# Patient Record
Sex: Female | Born: 1960 | Race: Black or African American | Hispanic: No | Marital: Married | Smoking: Never smoker
Health system: Southern US, Community
[De-identification: ages and names within clinical notes are randomized; demographics above are authoritative.]

## PROBLEM LIST (undated history)

## (undated) DIAGNOSIS — N760 Acute vaginitis: Secondary | ICD-10-CM

## (undated) DIAGNOSIS — I10 Essential (primary) hypertension: Secondary | ICD-10-CM

## (undated) DIAGNOSIS — E119 Type 2 diabetes mellitus without complications: Secondary | ICD-10-CM

## (undated) DIAGNOSIS — N946 Dysmenorrhea, unspecified: Secondary | ICD-10-CM

## (undated) DIAGNOSIS — D219 Benign neoplasm of connective and other soft tissue, unspecified: Secondary | ICD-10-CM

## (undated) DIAGNOSIS — N920 Excessive and frequent menstruation with regular cycle: Secondary | ICD-10-CM

## (undated) DIAGNOSIS — B9689 Other specified bacterial agents as the cause of diseases classified elsewhere: Secondary | ICD-10-CM

## (undated) HISTORY — DX: Excessive and frequent menstruation with regular cycle: N92.0

## (undated) HISTORY — DX: Other specified bacterial agents as the cause of diseases classified elsewhere: B96.89

## (undated) HISTORY — DX: Dysmenorrhea, unspecified: N94.6

## (undated) HISTORY — DX: Type 2 diabetes mellitus without complications: E11.9

## (undated) HISTORY — DX: Acute vaginitis: N76.0

## (undated) HISTORY — DX: Benign neoplasm of connective and other soft tissue, unspecified: D21.9

## (undated) HISTORY — DX: Essential (primary) hypertension: I10

---

## 1994-11-23 HISTORY — PX: TUBAL LIGATION: SHX77

## 1999-07-09 ENCOUNTER — Other Ambulatory Visit: Admission: RE | Admit: 1999-07-09 | Discharge: 1999-07-09 | Payer: Self-pay | Admitting: *Deleted

## 2002-07-31 ENCOUNTER — Encounter: Admission: RE | Admit: 2002-07-31 | Discharge: 2002-10-29 | Payer: Self-pay | Admitting: Family Medicine

## 2003-05-02 ENCOUNTER — Encounter: Payer: Self-pay | Admitting: Family Medicine

## 2003-05-02 ENCOUNTER — Ambulatory Visit (HOSPITAL_COMMUNITY): Admission: RE | Admit: 2003-05-02 | Discharge: 2003-05-02 | Payer: Self-pay | Admitting: Family Medicine

## 2003-08-07 ENCOUNTER — Encounter: Admission: RE | Admit: 2003-08-07 | Discharge: 2003-11-05 | Payer: Self-pay | Admitting: Family Medicine

## 2003-11-07 ENCOUNTER — Ambulatory Visit (HOSPITAL_COMMUNITY): Admission: RE | Admit: 2003-11-07 | Discharge: 2003-11-07 | Payer: Self-pay | Admitting: Family Medicine

## 2005-05-11 ENCOUNTER — Ambulatory Visit (HOSPITAL_COMMUNITY): Admission: RE | Admit: 2005-05-11 | Discharge: 2005-05-11 | Payer: Self-pay | Admitting: Family Medicine

## 2006-09-24 ENCOUNTER — Ambulatory Visit (HOSPITAL_COMMUNITY): Admission: RE | Admit: 2006-09-24 | Discharge: 2006-09-24 | Payer: Self-pay | Admitting: Family Medicine

## 2007-10-04 ENCOUNTER — Ambulatory Visit (HOSPITAL_COMMUNITY): Admission: RE | Admit: 2007-10-04 | Discharge: 2007-10-04 | Payer: Self-pay | Admitting: Family Medicine

## 2008-10-05 ENCOUNTER — Ambulatory Visit (HOSPITAL_COMMUNITY): Admission: RE | Admit: 2008-10-05 | Discharge: 2008-10-05 | Payer: Self-pay | Admitting: Family Medicine

## 2009-10-16 ENCOUNTER — Ambulatory Visit (HOSPITAL_COMMUNITY): Admission: RE | Admit: 2009-10-16 | Discharge: 2009-10-16 | Payer: Self-pay | Admitting: Family Medicine

## 2010-11-13 ENCOUNTER — Ambulatory Visit (HOSPITAL_COMMUNITY)
Admission: RE | Admit: 2010-11-13 | Discharge: 2010-11-13 | Payer: Self-pay | Source: Home / Self Care | Attending: Family Medicine | Admitting: Family Medicine

## 2011-02-10 ENCOUNTER — Encounter: Payer: Self-pay | Admitting: Interventional Cardiology

## 2011-10-20 ENCOUNTER — Other Ambulatory Visit (HOSPITAL_COMMUNITY): Payer: Self-pay | Admitting: Family Medicine

## 2011-10-20 DIAGNOSIS — Z1231 Encounter for screening mammogram for malignant neoplasm of breast: Secondary | ICD-10-CM

## 2011-11-20 ENCOUNTER — Ambulatory Visit (HOSPITAL_COMMUNITY)
Admission: RE | Admit: 2011-11-20 | Discharge: 2011-11-20 | Disposition: A | Discharge status: Home / Self Care | Payer: BC Managed Care – PPO | Source: Ambulatory Visit | Attending: Family Medicine | Admitting: Family Medicine

## 2011-11-20 DIAGNOSIS — Z1231 Encounter for screening mammogram for malignant neoplasm of breast: Secondary | ICD-10-CM

## 2012-02-16 ENCOUNTER — Encounter (INDEPENDENT_AMBULATORY_CARE_PROVIDER_SITE_OTHER): Payer: BC Managed Care – PPO | Admitting: Obstetrics and Gynecology

## 2012-02-16 DIAGNOSIS — N760 Acute vaginitis: Secondary | ICD-10-CM

## 2012-02-16 DIAGNOSIS — T192XXA Foreign body in vulva and vagina, initial encounter: Secondary | ICD-10-CM

## 2012-09-26 ENCOUNTER — Ambulatory Visit (INDEPENDENT_AMBULATORY_CARE_PROVIDER_SITE_OTHER): Payer: BC Managed Care – PPO | Admitting: Obstetrics and Gynecology

## 2012-09-26 ENCOUNTER — Encounter: Payer: Self-pay | Admitting: Obstetrics and Gynecology

## 2012-09-26 VITALS — BP 142/78 | Temp 98.8°F | Ht 62.25 in | Wt 186.0 lb

## 2012-09-26 DIAGNOSIS — N760 Acute vaginitis: Secondary | ICD-10-CM | POA: Insufficient documentation

## 2012-09-26 DIAGNOSIS — N946 Dysmenorrhea, unspecified: Secondary | ICD-10-CM | POA: Insufficient documentation

## 2012-09-26 DIAGNOSIS — N920 Excessive and frequent menstruation with regular cycle: Secondary | ICD-10-CM

## 2012-09-26 DIAGNOSIS — I10 Essential (primary) hypertension: Secondary | ICD-10-CM | POA: Insufficient documentation

## 2012-09-26 DIAGNOSIS — D219 Benign neoplasm of connective and other soft tissue, unspecified: Secondary | ICD-10-CM

## 2012-09-26 DIAGNOSIS — D649 Anemia, unspecified: Secondary | ICD-10-CM

## 2012-09-26 MED ORDER — MEDROXYPROGESTERONE ACETATE 10 MG PO TABS: ORAL_TABLET | ORAL | Status: DC

## 2012-09-26 MED ORDER — MISOPROSTOL 200 MCG PO TABS: ORAL_TABLET | ORAL | Status: DC

## 2012-09-26 NOTE — Progress Notes (Signed)
MENORRHAGIA:  When did bleeding start: 09/20/2012 How  Long: having 2 heavy cycles a month since 03/2012 How often changing pad/tampon: every 15 minutes Bleeding Disorders: no Cramping: yes Contraception: yes BTL Fibroids: yes questionable submucosal on last u/s Hormone Therapy: no New Medications: no Menopausal Symptoms: no Vag. Discharge: no Abdominal Pain: no Increased Stress: no  Orthostatic Pulses  Lying:       20  Sitting:     19  Standing: 22  Subjective:     Amber Kelly is a 51 y.o. woman,G3P2, who presents for irregular menses.  Bleeding pattern: Pt had cycle 08/31/2012-09/09/2012 which was extremely heavy with clots, Leaked through pants. Started again on 09/20/2012 and is still having today. States that cycle has lightened up a bit. States that her bleeding feels like "someone turned on a facet". States that she has been having 2 cycles per month since 03/2012.  Prior to that she had regular monthly cycles with no missed menses.  Denies any urinary tract symptoms, changes in bowel movements, nausea, vomiting or fever.   Current contraception: tubal ligation.  The following portions of the patient's history were reviewed and updated as appropriate: allergies, current medications, past family history, past medical history, past social history and past surgical history.  Review of Systems Pertinent items are noted in HPI.    Objective:    BP 142/78  Temp 98.8 F (37.1 C) (Oral)  Ht 5' 2.25" (1.581 m)  Wt 186 lb (84.369 kg)  BMI 33.75 kg/m2  LMP 09/20/2012  Weight:  Wt Readings from Last 1 Encounters:  09/26/12 186 lb (84.369 kg)    BMI: Body mass index is 33.75 kg/(m^2).  General Appearance: Alert, appropriate appearance for age. No acute distress HEENT: Grossly normal Neck / Thyroid: Supple, no masses, nodes or enlargement Lungs: clear to auscultation bilaterally Back: No CVA tenderness Cardiovascular: Regular rate and rhythm. S1, S2, no  murmur Gastrointestinal: Soft, non-tender, no masses or organomegaly Pelvic Exam: External genitalia: normal general appearance Vaginal: normal rugae and Blood in vault Cervix: presence of blood from cervical os and No contact bleeding Adnexa: non palpable Uterus: irregular enlargement and 10 wks size Exam limited by body habitus Rectovaginal: not indicated and deferred      Assessment:   Fibroids, probably submucosal Menorrhagia    Plan:   Provera 40 mg daily until bleeding stops SHG at that time with endo bx Therapeutic decision to be based on those results  Dierdre Forth MD

## 2012-09-27 NOTE — Addendum Note (Signed)
Addended by: Lerry Liner D on: 09/27/2012 08:47 AM   Modules accepted: Orders

## 2012-09-28 LAB — PAP IG W/ RFLX HPV ASCU

## 2012-10-04 ENCOUNTER — Telehealth: Payer: Self-pay | Admitting: Obstetrics and Gynecology

## 2012-10-10 ENCOUNTER — Other Ambulatory Visit: Payer: Self-pay

## 2012-10-10 DIAGNOSIS — N920 Excessive and frequent menstruation with regular cycle: Secondary | ICD-10-CM

## 2012-10-14 ENCOUNTER — Other Ambulatory Visit: Payer: Self-pay | Admitting: Obstetrics and Gynecology

## 2012-10-14 ENCOUNTER — Encounter: Payer: Self-pay | Admitting: Obstetrics and Gynecology

## 2012-10-14 ENCOUNTER — Ambulatory Visit (INDEPENDENT_AMBULATORY_CARE_PROVIDER_SITE_OTHER): Payer: BC Managed Care – PPO

## 2012-10-14 ENCOUNTER — Ambulatory Visit (INDEPENDENT_AMBULATORY_CARE_PROVIDER_SITE_OTHER): Payer: BC Managed Care – PPO | Admitting: Obstetrics and Gynecology

## 2012-10-14 VITALS — BP 130/76 | Temp 99.3°F

## 2012-10-14 DIAGNOSIS — N920 Excessive and frequent menstruation with regular cycle: Secondary | ICD-10-CM

## 2012-10-14 DIAGNOSIS — D649 Anemia, unspecified: Secondary | ICD-10-CM

## 2012-10-14 DIAGNOSIS — D259 Leiomyoma of uterus, unspecified: Secondary | ICD-10-CM

## 2012-10-14 DIAGNOSIS — D219 Benign neoplasm of connective and other soft tissue, unspecified: Secondary | ICD-10-CM

## 2012-10-14 NOTE — Progress Notes (Signed)
SONOHYSTEROGRAM  Indications for the procedure, risks and benefits have been reviewed.  Questions were answered.   A permit has been signed.  An ultrasound was performed.   PROCEDURE The vagina and cervix were prepped with Betadine.  The sonohysterogram catheter was placed inside the uterus.  30 cc of sterile saline were injected.  A 3-D ultrasound was performed.  An endometrial biopsy was performed per protocol   The patient tolerated her procedure well.  All instruments were removed.  The patient was returned to the supine position.   ULTRASOUND: Uterus: Length: 12.0 cm   Width:  8.12 cm   Height:  7.66 cm  Endo thickness:  0.777 cm   Left ovary:Normal Right ovary:Normal Fibroids:yes Myometrial fibroids    1) RT fundus:4.4cmx4.2cmx5.9cm                              2) RT LUS 3.2cmx3cmx3.7. Several 2cm fibroids are seen left of uterine midline  CDS fluid:no  Comment: Technically difficult due to fibroid involvement. There is a solid appearing focus in the LUS. This is suggestive of submucosal fibroid(s). Fundal cavity images were compromised due to posterior uterus angle and fibroid location. Adnexas-normal.  ASSESSMENT: Uterine fibroids with probable submucosal component Menorrhagia in perimenopausal age range Hx anemia  RECOMMENDATION: Options for management of fibroids reviewed: Myomectomy, may be amenable to hysteroscopic resection        Lupron Depot        Continuous hormonal suppression with Provera        Hysterectomy Pt wants hysterectomy when school is out. Last day of school is June 16. She will followup in 4 weeks to review bleeding pattern. She understands that inability to control menses may necessitate sooner surgery

## 2012-10-14 NOTE — Patient Instructions (Signed)
Hysterectomy Information  A hysterectomy is a procedure where your uterus is surgically removed. It will no longer be possible to have menstrual periods or to become pregnant. The tubes and ovaries can be removed (bilateral salpingo-oopherectomy) during this surgery as well.  REASONS FOR A HYSTERECTOMY  Persistent, abnormal bleeding.  Lasting (chronic) pelvic pain or infection.  The lining of the uterus (endometrium) starts growing outside the uterus (endometriosis).  The endometrium starts growing in the muscle of the uterus (adenomyosis).  The uterus falls down into the vagina (pelvic organ prolapse).  Symptomatic uterine fibroids.  Precancerous cells.  Cervical cancer or uterine cancer. TYPES OF HYSTERECTOMIES  Supracervical hysterectomy. This type removes the top part of the uterus, but not the cervix.  Total hysterectomy. This type removes the uterus and cervix.  Radical hysterectomy. This type removes the uterus, cervix, and the fibrous tissue that holds the uterus in place in the pelvis (parametrium). WAYS A HYSTERECTOMY CAN BE PERFORMED  Abdominal hysterectomy. A large surgical cut (incision) is made in the abdomen. The uterus is removed through this incision.  Vaginal hysterectomy. An incision is made in the vagina. The uterus is removed through this incision. There are no abdominal incisions.  Conventional laparoscopic hysterectomy. A thin, lighted tube with a camera (laparoscope) is inserted into 3 or 4 small incisions in the abdomen. The uterus is cut into small pieces. The small pieces are removed through the incisions, or they are removed through the vagina.  Laparoscopic assisted vaginal hysterectomy (LAVH). Three or four small incisions are made in the abdomen. Part of the surgery is performed laparoscopically and part vaginally. The uterus is removed through the vagina.  Robot-assisted laparoscopic hysterectomy. A laparoscope is inserted into 3 or 4 small  incisions in the abdomen. A computer-controlled device is used to give the surgeon a 3D image. This allows for more precise movements of surgical instruments. The uterus is cut into small pieces and removed through the incisions or removed through the vagina. RISKS OF HYSTERECTOMY   Bleeding and risk of blood transfusion. Tell your caregiver if you do not want to receive any blood products.  Blood clots in the legs or lung.  Infection.  Injury to surrounding organs.  Anesthesia problems or side effects.  Conversion to an abdominal hysterectomy. WHAT TO EXPECT AFTER A HYSTERECTOMY  You will be given pain medicine.  You will need to have someone with you for the first 3 to 5 days after you go home.  You will need to follow up with your surgeon in 2 to 4 weeks after surgery to evaluate your progress.  You may have early menopause symptoms like hot flashes, night sweats, and insomnia.  If you had a hysterectomy for a problem that was not a cancer or a condition that could lead to cancer, then you no longer need Pap tests. However, even if you no longer need a Pap test, a regular exam is a good idea to make sure no other problems are starting. Document Released: 05/05/2001 Document Revised: 02/01/2012 Document Reviewed: 06/20/2011 ExitCare Patient Information 2013 [employer].   Uterine Fibroid A uterine fibroid is a growth (tumor) that occurs in a woman's uterus. This type of tumor is not cancerous and does not spread out of the uterus. A woman can have one or many fibroids, and the fiboid(s) can become quite large. A fibroid can vary in size, weight, and where it grows in the uterus. Most fibroids do not require medical treatment, but some can   cause pain or heavy bleeding during and between periods. CAUSES  A fibroid is the result of a single uterine cell that keeps growing (unregulated), which is different than most cells in the human body. Most cells have a control mechanism that  keeps them from reproducing without control.  SYMPTOMS   Bleeding.  Pelvic pain and pressure.  Bladder problems due to the size of the fibroid.  Infertility and miscarriages depending on the size and location of the fibroid. DIAGNOSIS  A diagnosis is made by physical exam. Your caregiver may feel the lumpy tumors during a pelvic exam. Important information regarding size, location, and number of tumors can be gained by having an ultrasound. It is rare that other tests, such as a CT scan or MRI, are needed. TREATMENT   Your caregiver may recommend watchful waiting. This involves getting the fibroid checked by your caregiver to see if the fibroids grow or shrink.   Hormonal treatment or an intrauterine device (IUD) may be prescribed.   Surgery may be needed to remove the fibroids (myomectomy) or the uterus (hysterectomy). This depends on your situation. When fibroids interfere with fertility and a woman wants to become pregnant, a caregiver may recommend having the fibroids removed.  HOME CARE INSTRUCTIONS  Home care depends on how you were treated. In general:   Keep all follow-up appointments with your caregiver.   Only take medicine as told by your caregiver. Do not take aspirin. It can cause bleeding.   If you have excessive periods and soak tampons or pads in a half hour or less, contact your caregiver immediately. If your periods are troublesome but not so heavy, lie down with your feet raised slightly above your heart. Place cold packs on your lower abdomen.   If your periods are heavy, write down the number of pads or tampons you use per month. Bring this information to your caregiver.   Talk to your caregiver about taking iron pills.   Include green vegetables in your diet.   If you were prescribed a hormonal treatment, take the hormonal medicines as directed.   If you need surgery, ask your caregiver for information on your specific surgery.  SEEK IMMEDIATE  MEDICAL CARE IF:  You have pelvic pain or cramps not controlled with medicines.   You have a sudden increase in pelvic pain.   You have an increase of bleeding between and during periods.   You feel lightheaded or have fainting episodes.  MAKE SURE YOU:  Understand these instructions.  Will watch your condition.  Will get help right away if you are not doing well or get worse. Document Released: 11/06/2000 Document Revised: 02/01/2012 Document Reviewed: 11/30/2011 ExitCare Patient Information 2013 [employer].  

## 2012-10-18 LAB — PATHOLOGY

## 2012-10-24 ENCOUNTER — Other Ambulatory Visit (HOSPITAL_COMMUNITY): Payer: Self-pay | Admitting: Family Medicine

## 2012-10-24 DIAGNOSIS — Z1231 Encounter for screening mammogram for malignant neoplasm of breast: Secondary | ICD-10-CM

## 2012-10-25 ENCOUNTER — Encounter: Payer: BC Managed Care – PPO | Admitting: Obstetrics and Gynecology

## 2012-10-25 ENCOUNTER — Other Ambulatory Visit: Payer: Self-pay | Admitting: Obstetrics and Gynecology

## 2012-10-27 ENCOUNTER — Telehealth: Payer: Self-pay | Admitting: Obstetrics and Gynecology

## 2012-10-28 NOTE — Telephone Encounter (Signed)
Pt c/o still having light bleeding, only uses 1 pad per day and has bleeding when wiping after using the restroom. Pt states she was under the impression that the bleeding would stop completely. Pt is now out of medroxyprogesterone pills and states that she thought another rx was supposed to be sent in to her pharmacy. What would you like for pt plan of care?

## 2012-10-31 ENCOUNTER — Telehealth: Payer: Self-pay | Admitting: Obstetrics and Gynecology

## 2012-10-31 NOTE — Telephone Encounter (Signed)
TC to pt. But unable to hear pt. Called again and LM to return call.  No answer at home #.

## 2012-10-31 NOTE — Telephone Encounter (Signed)
TC to pt.  States complete Provera 10/28/12.  Began with increased bleeding 10/29/12, using 3 full pads/day. Questioning if needs Rf to be on Provera continuously.  Will consult with Dr Maryellen Pile.

## 2012-11-02 NOTE — Telephone Encounter (Signed)
Restart Provera 40 mg daily

## 2012-11-02 NOTE — Telephone Encounter (Signed)
Lm on vm to cb per [facility] recs.  

## 2012-11-03 MED ORDER — MEDROXYPROGESTERONE ACETATE 10 MG PO TABS: ORAL_TABLET | ORAL | Status: DC

## 2012-11-03 NOTE — Telephone Encounter (Signed)
Tc from pt. Informed pt to restart Provera 40mg  daily. Pt has a fu appt on 11/21/12 with [facility]. Rx e-pres to pharm on file.  Pt agrees.

## 2012-11-09 NOTE — Telephone Encounter (Signed)
Completed by cw

## 2012-11-21 ENCOUNTER — Ambulatory Visit (HOSPITAL_COMMUNITY)
Admission: RE | Admit: 2012-11-21 | Discharge: 2012-11-21 | Disposition: A | Discharge status: Home / Self Care | Payer: BC Managed Care – PPO | Source: Ambulatory Visit | Attending: Family Medicine | Admitting: Family Medicine

## 2012-11-21 DIAGNOSIS — Z1231 Encounter for screening mammogram for malignant neoplasm of breast: Secondary | ICD-10-CM | POA: Insufficient documentation

## 2012-11-22 ENCOUNTER — Encounter: Payer: Self-pay | Admitting: Obstetrics and Gynecology

## 2012-11-22 ENCOUNTER — Ambulatory Visit (INDEPENDENT_AMBULATORY_CARE_PROVIDER_SITE_OTHER): Payer: BC Managed Care – PPO | Admitting: Obstetrics and Gynecology

## 2012-11-22 VITALS — BP 112/70 | Ht 62.0 in | Wt 185.0 lb

## 2012-11-22 DIAGNOSIS — D219 Benign neoplasm of connective and other soft tissue, unspecified: Secondary | ICD-10-CM

## 2012-11-22 DIAGNOSIS — D259 Leiomyoma of uterus, unspecified: Secondary | ICD-10-CM

## 2012-11-22 DIAGNOSIS — D649 Anemia, unspecified: Secondary | ICD-10-CM

## 2012-11-22 DIAGNOSIS — N920 Excessive and frequent menstruation with regular cycle: Secondary | ICD-10-CM

## 2012-11-22 LAB — HEMOGLOBIN: Hemoglobin: 10.9 g/dL — ABNORMAL LOW (ref 12.0–15.0)

## 2012-11-22 MED ORDER — MEDROXYPROGESTERONE ACETATE 10 MG PO TABS: ORAL_TABLET | ORAL | Status: DC

## 2012-11-22 NOTE — Progress Notes (Signed)
GYN PROBLEM VISIT  Subjective:Ms. Amber Kelly is a 51 y.o. year old female,G3P2, who presents for a problem visit. Bled from [facility] for about a week. Took last Provera 10/28/12 and  restarted bleeding.  Started Provera again 11/03/12 and bleeding stopped until 11/17/12, and continues with that light bleeding through today.  Has continued taking iron.  Denies light headedness, SOB or chest pain.  Objective: BP 112/70  Ht 5\' 2"  (1.575 m)  Wt 185 lb (83.915 kg)  BMI 33.84 kg/m2  LMP 11/17/2012  LMP 11/17/2012   GI: suprapubic mass which is nontender  External genitalia: blood covered Vaginal: Blood in vault Cervix: normal appearance Adnexa: No masses felt Uterus: irregular enlargement 12 wks size Rectal: deferred Exam limited by body habitus  Assessment: Menorrhagia Fibroids, probable submucosal component Some improvement with Provera Anemia, no hemodynamic sx at this time. Chronic hypertension  Plan: Pt wants hysterectomy as definitive therapy for menorrhagia, but wants to try to delay until summer Continue Provera 40 mg daily Continue Fe hgb today Discussed with pt that if bleeding is not controlled by Provera or if Hgb is not stable or increasing on iron, that I would advise hysterectomy sooner than summer. F/U 6 wks for aex and review of bleeding    Dierdre Forth, MD  11/22/2012 8:55 AM

## 2012-12-07 ENCOUNTER — Ambulatory Visit: Payer: BC Managed Care – PPO | Admitting: Obstetrics and Gynecology

## 2013-01-03 ENCOUNTER — Encounter: Payer: Self-pay | Admitting: Obstetrics and Gynecology

## 2013-01-03 ENCOUNTER — Ambulatory Visit: Payer: BC Managed Care – PPO | Admitting: Obstetrics and Gynecology

## 2013-01-03 VITALS — BP 140/64 | HR 70 | Resp 16 | Ht 62.0 in | Wt 184.0 lb

## 2013-01-03 DIAGNOSIS — D649 Anemia, unspecified: Secondary | ICD-10-CM | POA: Insufficient documentation

## 2013-01-03 DIAGNOSIS — E669 Obesity, unspecified: Secondary | ICD-10-CM

## 2013-01-03 DIAGNOSIS — N920 Excessive and frequent menstruation with regular cycle: Secondary | ICD-10-CM

## 2013-01-03 DIAGNOSIS — D219 Benign neoplasm of connective and other soft tissue, unspecified: Secondary | ICD-10-CM

## 2013-01-03 DIAGNOSIS — N946 Dysmenorrhea, unspecified: Secondary | ICD-10-CM

## 2013-01-03 LAB — TSH: TSH: 0.436 u[IU]/mL (ref 0.350–4.500)

## 2013-01-03 LAB — HEMOGLOBIN AND HEMATOCRIT, BLOOD
HCT: 37 % (ref 36.0–46.0)
Hemoglobin: 12.5 g/dL (ref 12.0–15.0)

## 2013-01-03 NOTE — Progress Notes (Signed)
AEX  Last Pap: 08/2012 WNL: Yes Regular Periods:yes Contraception: BTL  Monthly Breast exam:yes Tetanus<40yrs:yes Nl.Bladder Function:yes Daily BMs:yes Healthy Diet:no Calcium:yes Mammogram:yes Date of Mammogram: 10/2012 WNL Exercise:yes Have often Exercise: 2 x wk Seatbelt: yes Abuse at home: no Stressful work:no Sigmoid-colonoscopy: None Bone Density: No PCP: Noelle Redmon Change in PMH: None Change in RUE:AVWU  Subjective:    Amber Kelly is a 52 y.o. female, G3P2, who presents for an annual exam. Bleeding is much lighter, but still having almost daily bleeding since 11/22/12 visit.  Cramping occurred only 12/26/12 as if menses were beginnng and bleeding was slightly heavier, but not as heavy as prior to Provera. No other abdominal pain.  No nausea or vomiting.  No dyspareunia, no increased bleeding with intercourse.  Taking iron supplement daily    History   Social History  . Marital Status: Married    Spouse Name: N/A    Number of Children: N/A  . Years of Education: N/A   Social History Main Topics  . Smoking status: Never Smoker   . Smokeless tobacco: Never Used  . Alcohol Use: No  . Drug Use: No  . Sexually Active: Yes    Birth Control/ Protection: Surgical     Comment: btl   Other Topics Concern  . None   Social History Narrative  . None    Menstrual cycle:   LMP: Patient's last menstrual period was 12/26/2012.           Cycle: see above  The following portions of the patient's history were reviewed and updated as appropriate: allergies, current medications, past family history, past medical history, past social history, past surgical history and problem list.  Review of Systems Pertinent items are noted in HPI. Breast:Negative for breast lump,nipple discharge or nipple retraction Gastrointestinal: Negative for abdominal pain, change in bowel habits or rectal bleeding Urinary:negative   Objective:    BP 140/64  Pulse 70  Resp 16  Ht 5'  2" (1.575 m)  Wt 184 lb (83.462 kg)  BMI 33.65 kg/m2  LMP 12/26/2012    Weight:  Wt Readings from Last 1 Encounters:  01/03/13 184 lb (83.462 kg)          BMI: Body mass index is 33.65 kg/(m^2).  General Appearance: Alert, appropriate appearance for age. No acute distress HEENT: Grossly normal Neck / Thyroid: Supple, no masses, nodes or enlargement Lungs: clear to auscultation bilaterally Back: No CVA tenderness Breast Exam: No masses or nodes.No dimpling, nipple retraction or discharge. Cardiovascular: Regular rate and rhythm. S1, S2, no murmur Gastrointestinal: Soft, non-tender, no masses or organomegaly Pelvic Exam: External genitalia: normal general appearance Vaginal: normal rugae and blood in vault Cervix: normal appearance and presence of blood from cervical os Adnexa: cannot separate from enlarged uterus Uterus: irregular enlargement about 16 weeks size with some mobility. Exam limited by body habitus Rectovaginal: normal rectal, no masses Lymphatic Exam: Non-palpable nodes in neck, clavicular, axillary, or inguinal regions Skin: no rash or abnormalities Neurologic: Normal gait and speech, no tremor  Psychiatric: Alert and oriented, appropriate affect.   Wet Prep:not applicable Urinalysis:not applicable UPT: Not done   Assessment:    Fibroids  Abnormal uterine bleeding not responsive to Provera Anemia Hx c/s x 2    Plan:    mammogram pap smear additional lab tests per orders STD screening: declined Contraception:bilateral tubal ligation Continue iron Followup based on labs    Dierdre Forth MD

## 2013-04-26 ENCOUNTER — Other Ambulatory Visit: Payer: Self-pay | Admitting: Obstetrics and Gynecology

## 2013-05-08 ENCOUNTER — Encounter (HOSPITAL_COMMUNITY): Payer: Self-pay | Admitting: Pharmacist

## 2013-05-09 ENCOUNTER — Encounter: Payer: Self-pay | Admitting: Obstetrics and Gynecology

## 2013-05-10 ENCOUNTER — Other Ambulatory Visit (HOSPITAL_COMMUNITY): Payer: Self-pay | Admitting: Obstetrics and Gynecology

## 2013-05-10 NOTE — H&P (Signed)
Amber Kelly is a 52 y.o. female,  P 2-0-1-2 who presents for hysterectomy because of symptomatic uterine fibroids and menorrhagia.  Patient had normal monthly periods until October 2013 when she began to "flow like a faucet"  requriing overnight pad change every 30 minutes then it gradually tapered to more spotting.  She didn't notice any bleeding for about 3 weeks then resumed in November with the need to change a tampon with panty liner 3 times a day.  Cramping with this bleeding was minimal compared to her usual cramps  rated as 7/10 on a 10 point pain scale.  Patient's TSH was normal hemoglobin was 10.9 so she began iron supplementation. A pelvic ultrasound in November  2013 showed: uterus-12.0 x 8.12 x 7.66 cm with an endometrium-0.77 cm.  There were #2 measurable fibroids: right fundal-4.4 x 4.2 x 5.9 cm,  right lower uterine segment-3.2 x 3.0 x 3.7 cm and several  2 cm myometrial fibroids seen left of the uterus midline. A sono-hysterogram at that time  revealed a possible  solid focus in the lower uterine segment with question of a submucosal fibroid however the procedure was technically difficult due to fundal cavity changes caused by fibroids and the  posterior uterine angle.  Both ovaries appeared normal on this study.  An endometrial biopsy during this evaluation was benign.  The patient admits to urinary frequency but denies urinary urgency, incontinence changes in bowel function, vaginitis symptoms or dyspareunia.  The patient was placed on Provera 40 mg daily to manage her persistent bleeding.  As long as she took the medication, her bleeding only required the use of a tampon with panty liner daily but if she did not take her medication as directed, the flow with increase.  A review of both medical and surgical management options were given to the patient and after careful consideration she decided to proceed with definitive therapy in the form of hysterectomy.   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 amLODipine-benazepril (LOTREL) 10-40 MG per capsule Take 1 capsule by mouth daily.      Marland Kitchen CALCIUM PO Take 1 tablet by mouth daily.      . cloNIDine (CATAPRES) 0.1 MG tablet Take 0.1 mg by mouth 2 (two) times daily.      . cycloSPORINE (RESTASIS) 0.05 % ophthalmic emulsion Place 1 drop into both eyes 2 (two) times daily.      Marland Kitchen glipiZIDE (GLUCOTROL) 5 MG tablet Take 5 mg by mouth daily before breakfast.       . iron polysaccharides (NIFEREX) 150 MG capsule Take 150 mg by mouth daily.       Marland Kitchen KRILL OIL OMEGA-3 PO Take 1 capsule by mouth daily. Mega Red      .  medroxyPROGESTERone (PROVERA) 10 MG tablet Pt to take 4 tablets by mouth daily(40mg  total)  120 tablet  12  . Multiple Vitamin (MULTIVITAMIN WITH MINERALS) TABS Take 1 tablet by mouth daily.      . valsartan-hydrochlorothiazide (DIOVAN-HCT) 160-25 MG per tablet Take 1 tablet by mouth daily.       No facility-administered encounter medications on file as of 05/10/2013.  Diclofenac 75 mg prn Ferex 150 mg daily Ketoconazole 2% Shampoo prn Lotemax 0.5% Eye Gel Drops daily  Denies sensitivity to peanuts, shellfish, soy, latex or  adhesives. (Pistachios will increase her blood pressure)  No Known Allergies  ROS: Admits to glasses but  denies headache, vision changes, nasal congestion, dysphagia, tinnitus, dizziness, hoarseness, cough,  chest pain, shortness of breath, nausea, vomiting, diarrhea,constipation,  urgency  dysuria, hematuria, vaginitis symptoms, pelvic pain, swelling of joints,easy bruising,  myalgias, arthralgias, skin rashes, unexplained weight loss and except as is mentioned in the history of present illness, patient's review of systems is otherwise negative.   Physical Exam  Bp  110/78  P 78  R 12  T 99.4 degrees F orally  Weight  183  Height  5'4"    BMI  31.4  Neck: supple without masses or thyromegaly Lungs: clear to auscultation Heart: regular rate and rhythm Abdomen: soft, non-tender, with a firm mass from pelvis to mid-position between symphysis pubis and umbilicus;  no organomegaly Pelvic:EGBUS- wnl; vagina-normal rugae with scant blood; uterus-irregular, 14-16 week size, cervix without lesions or motion tenderness; adnexae-no tenderness or masses Extremities:  no clubbing, cyanosis or edema   Assesment:  Symptomatic Uterine Fibroids                        Menorrhagia                        H/O Anemia                           Disposition:  A discussion was held with patient regarding the indication for her procedure(s) along with the risks, which include but are not limited to: reaction to anesthesia, damage to adjacent organs, infection and excessive bleeding. The patient verbalized understanding of these risks and has consented to proceed with a Total Abdominal Hysterectomy with Bilateral Salpingectomy at [facility] on May 16, 2013 at 9:45 a.m.   CSN# [id number]   Phil Michels J. Lowell Guitar, PA-C  for Dr. Maris Berger. Haygood

## 2013-05-11 ENCOUNTER — Encounter (HOSPITAL_COMMUNITY): Payer: Self-pay

## 2013-05-11 ENCOUNTER — Encounter (HOSPITAL_COMMUNITY)
Admission: RE | Admit: 2013-05-11 | Discharge: 2013-05-11 | Disposition: A | Discharge status: Home / Self Care | Payer: BC Managed Care – PPO | Source: Ambulatory Visit | Attending: Obstetrics and Gynecology | Admitting: Obstetrics and Gynecology

## 2013-05-11 DIAGNOSIS — Z01812 Encounter for preprocedural laboratory examination: Secondary | ICD-10-CM | POA: Insufficient documentation

## 2013-05-11 DIAGNOSIS — Z01818 Encounter for other preprocedural examination: Secondary | ICD-10-CM | POA: Insufficient documentation

## 2013-05-11 LAB — BASIC METABOLIC PANEL
BUN: 8 mg/dL (ref 6–23)
CO2: 28 mEq/L (ref 19–32)
Calcium: 9.7 mg/dL (ref 8.4–10.5)
Chloride: 101 mEq/L (ref 96–112)
Creatinine, Ser: 0.63 mg/dL (ref 0.50–1.10)
Glucose, Bld: 229 mg/dL — ABNORMAL HIGH (ref 70–99)

## 2013-05-11 LAB — CBC
HCT: 36.4 % (ref 36.0–46.0)
Hemoglobin: 12.2 g/dL (ref 12.0–15.0)
MCH: 27.4 pg (ref 26.0–34.0)
MCHC: 33.5 g/dL (ref 30.0–36.0)
MCV: 81.6 fL (ref 78.0–100.0)
RDW: 13.2 % (ref 11.5–15.5)

## 2013-05-11 NOTE — Pre-Procedure Instructions (Signed)
Chandra/Dr Haygood's office called to confirm labs for glucose and K+, she states MD made aware.

## 2013-05-11 NOTE — Pre-Procedure Instructions (Signed)
Glucose of 229 and K+ of 3.2 reported to Dr Malen Gauze, no orders given. Left message for MD nurse.

## 2013-05-11 NOTE — Patient Instructions (Addendum)
[facility]  05/11/2013   Your procedure is scheduled on:  05/16/13  Enter through the Main Entrance of [facility] at 8 AM.  Pick up the phone at the desk and dial [phone].   Call this number if you have problems the morning of surgery: [phone]   Remember:   Do not eat food:After Midnight.  Do not drink clear liquids: After Midnight.  Take these medicines the morning of surgery with A SIP OF WATER: all blood pressure medication, hold diabetes medication, iron and vitamins.   Do not wear jewelry, make-up or nail polish.  Do not wear lotions, powders, or perfumes. You may wear deodorant.  Do not shave 48 hours prior to surgery.  Do not bring valuables to the hospital.  [facility] is not responsible                  for any belongings or valuables brought to the hospital.  Contacts, dentures or bridgework may not be worn into surgery.  Leave suitcase in the car. After surgery it may be brought to your room.  For patients admitted to the hospital, checkout time is 11:00 AM the day of                discharge.   Patients discharged the day of surgery will not be allowed to drive                   home.  Name and phone number of your driver: NA  Special Instructions: Shower using CHG 2 nights before surgery and the night before surgery.  If you shower the day of surgery use CHG.  Use special wash - you have one bottle of CHG for all showers.  You should use approximately 1/3 of the bottle for each shower.   Please read over the following fact sheets that you were given: Surgical Site Infection Prevention

## 2013-05-15 ENCOUNTER — Encounter (HOSPITAL_COMMUNITY): Payer: Self-pay | Admitting: Obstetrics and Gynecology

## 2013-05-15 MED ORDER — DEXTROSE 5 % IV SOLN
2.0000 g | INTRAVENOUS | Status: AC
  Administered 2013-05-16: 2 g via INTRAVENOUS
  Filled 2013-05-15: qty 2

## 2013-05-16 ENCOUNTER — Encounter (HOSPITAL_COMMUNITY)
Admission: RE | Disposition: A | Discharge status: Home / Self Care | Payer: Self-pay | Source: Ambulatory Visit | Attending: Obstetrics and Gynecology

## 2013-05-16 ENCOUNTER — Inpatient Hospital Stay (HOSPITAL_COMMUNITY)
Admission: RE | Admit: 2013-05-16 | Discharge: 2013-05-18 | DRG: 358 | Disposition: A | Discharge status: Home / Self Care | Payer: BC Managed Care – PPO | Source: Ambulatory Visit | Attending: Obstetrics and Gynecology | Admitting: Obstetrics and Gynecology

## 2013-05-16 ENCOUNTER — Inpatient Hospital Stay (HOSPITAL_COMMUNITY): Payer: BC Managed Care – PPO | Admitting: Anesthesiology

## 2013-05-16 ENCOUNTER — Encounter (HOSPITAL_COMMUNITY): Payer: Self-pay | Admitting: Anesthesiology

## 2013-05-16 DIAGNOSIS — D219 Benign neoplasm of connective and other soft tissue, unspecified: Secondary | ICD-10-CM | POA: Diagnosis present

## 2013-05-16 DIAGNOSIS — E119 Type 2 diabetes mellitus without complications: Secondary | ICD-10-CM | POA: Diagnosis present

## 2013-05-16 DIAGNOSIS — D250 Submucous leiomyoma of uterus: Secondary | ICD-10-CM | POA: Diagnosis present

## 2013-05-16 DIAGNOSIS — E876 Hypokalemia: Secondary | ICD-10-CM | POA: Diagnosis present

## 2013-05-16 DIAGNOSIS — N920 Excessive and frequent menstruation with regular cycle: Principal | ICD-10-CM | POA: Diagnosis present

## 2013-05-16 DIAGNOSIS — D252 Subserosal leiomyoma of uterus: Secondary | ICD-10-CM | POA: Diagnosis present

## 2013-05-16 DIAGNOSIS — E669 Obesity, unspecified: Secondary | ICD-10-CM | POA: Diagnosis present

## 2013-05-16 DIAGNOSIS — N946 Dysmenorrhea, unspecified: Secondary | ICD-10-CM | POA: Diagnosis present

## 2013-05-16 DIAGNOSIS — D251 Intramural leiomyoma of uterus: Secondary | ICD-10-CM | POA: Diagnosis present

## 2013-05-16 DIAGNOSIS — D649 Anemia, unspecified: Secondary | ICD-10-CM | POA: Diagnosis present

## 2013-05-16 DIAGNOSIS — I10 Essential (primary) hypertension: Secondary | ICD-10-CM | POA: Diagnosis present

## 2013-05-16 HISTORY — PX: ABDOMINAL HYSTERECTOMY: SHX81

## 2013-05-16 LAB — GLUCOSE, CAPILLARY
Glucose-Capillary: 157 mg/dL — ABNORMAL HIGH (ref 70–99)
Glucose-Capillary: 131 mg/dL — ABNORMAL HIGH (ref 70–99)
Glucose-Capillary: 139 mg/dL — ABNORMAL HIGH (ref 70–99)
Glucose-Capillary: 132 mg/dL — ABNORMAL HIGH (ref 70–99)

## 2013-05-16 SURGERY — HYSTERECTOMY, ABDOMINAL
Anesthesia: General | Site: Abdomen | Laterality: Bilateral | Wound class: Clean Contaminated

## 2013-05-16 MED ORDER — MENTHOL 3 MG MT LOZG: 1.0000 | LOZENGE | OROMUCOSAL | Status: DC | PRN

## 2013-05-16 MED ORDER — LIDOCAINE HCL (CARDIAC) 20 MG/ML IV SOLN
INTRAVENOUS | Status: DC | PRN
  Administered 2013-05-16: 80 mg via INTRAVENOUS

## 2013-05-16 MED ORDER — FENTANYL CITRATE 0.05 MG/ML IJ SOLN
INTRAMUSCULAR | Status: DC | PRN
  Administered 2013-05-16: 100 ug via INTRAVENOUS
  Administered 2013-05-16: 50 ug via INTRAVENOUS
  Administered 2013-05-16: 100 ug via INTRAVENOUS

## 2013-05-16 MED ORDER — LACTATED RINGERS IV SOLN
INTRAVENOUS | Status: DC
  Administered 2013-05-16 – 2013-05-17 (×3): via INTRAVENOUS

## 2013-05-16 MED ORDER — ONDANSETRON HCL 4 MG/2ML IJ SOLN: 4.0000 mg | Freq: Four times a day (QID) | INTRAMUSCULAR | Status: DC | PRN

## 2013-05-16 MED ORDER — LACTATED RINGERS IV SOLN
INTRAVENOUS | Status: DC
  Administered 2013-05-16 (×3): via INTRAVENOUS

## 2013-05-16 MED ORDER — DIPHENHYDRAMINE HCL 50 MG/ML IJ SOLN: 12.5000 mg | Freq: Four times a day (QID) | INTRAMUSCULAR | Status: DC | PRN

## 2013-05-16 MED ORDER — HYDROMORPHONE HCL PF 1 MG/ML IJ SOLN
INTRAMUSCULAR | Status: DC | PRN
  Administered 2013-05-16 (×2): .5 mg via INTRAVENOUS

## 2013-05-16 MED ORDER — SODIUM CHLORIDE 0.9 % IJ SOLN: 9.0000 mL | INTRAMUSCULAR | Status: DC | PRN

## 2013-05-16 MED ORDER — METOCLOPRAMIDE HCL 5 MG/ML IJ SOLN: 10.0000 mg | Freq: Once | INTRAMUSCULAR | Status: DC | PRN

## 2013-05-16 MED ORDER — AMLODIPINE BESY-BENAZEPRIL HCL 10-40 MG PO CAPS: 1.0000 | ORAL_CAPSULE | Freq: Every day | ORAL | Status: DC

## 2013-05-16 MED ORDER — KETOROLAC TROMETHAMINE 30 MG/ML IJ SOLN
30.0000 mg | Freq: Four times a day (QID) | INTRAMUSCULAR | Status: AC
  Administered 2013-05-16 – 2013-05-17 (×3): 30 mg via INTRAVENOUS
  Filled 2013-05-16 (×3): qty 1

## 2013-05-16 MED ORDER — NEOSTIGMINE METHYLSULFATE 1 MG/ML IJ SOLN
INTRAMUSCULAR | Status: DC | PRN
  Administered 2013-05-16: 3 mg via INTRAVENOUS

## 2013-05-16 MED ORDER — ONDANSETRON HCL 4 MG/2ML IJ SOLN
INTRAMUSCULAR | Status: DC | PRN
  Administered 2013-05-16: 4 mg via INTRAVENOUS

## 2013-05-16 MED ORDER — NALOXONE HCL 0.4 MG/ML IJ SOLN: 0.4000 mg | INTRAMUSCULAR | Status: DC | PRN

## 2013-05-16 MED ORDER — BENAZEPRIL HCL 40 MG PO TABS
40.0000 mg | ORAL_TABLET | Freq: Every day | ORAL | Status: DC
  Administered 2013-05-16 – 2013-05-17 (×2): 40 mg via ORAL
  Filled 2013-05-16 (×3): qty 1

## 2013-05-16 MED ORDER — CYCLOSPORINE 0.05 % OP EMUL
1.0000 [drp] | Freq: Two times a day (BID) | OPHTHALMIC | Status: DC
  Administered 2013-05-16: 1 [drp] via OPHTHALMIC
  Administered 2013-05-17: 11:00:00 via OPHTHALMIC
  Administered 2013-05-17: 1 [drp] via OPHTHALMIC
  Filled 2013-05-16 (×4): qty 1

## 2013-05-16 MED ORDER — DIPHENHYDRAMINE HCL 12.5 MG/5ML PO ELIX: 12.5000 mg | ORAL_SOLUTION | Freq: Four times a day (QID) | ORAL | Status: DC | PRN

## 2013-05-16 MED ORDER — SCOPOLAMINE 1 MG/3DAYS TD PT72
MEDICATED_PATCH | TRANSDERMAL | Status: AC
  Administered 2013-05-16: 1.5 mg via TRANSDERMAL
  Filled 2013-05-16: qty 1

## 2013-05-16 MED ORDER — FENTANYL CITRATE 0.05 MG/ML IJ SOLN
INTRAMUSCULAR | Status: AC
  Filled 2013-05-16: qty 5

## 2013-05-16 MED ORDER — KETOROLAC TROMETHAMINE 30 MG/ML IJ SOLN
INTRAMUSCULAR | Status: DC | PRN
  Administered 2013-05-16: 30 mg via INTRAVENOUS

## 2013-05-16 MED ORDER — KETOROLAC TROMETHAMINE 30 MG/ML IJ SOLN
INTRAMUSCULAR | Status: AC
  Filled 2013-05-16: qty 1

## 2013-05-16 MED ORDER — LIDOCAINE HCL (CARDIAC) 20 MG/ML IV SOLN
INTRAVENOUS | Status: AC
  Filled 2013-05-16: qty 5

## 2013-05-16 MED ORDER — GLYCOPYRROLATE 0.2 MG/ML IJ SOLN
INTRAMUSCULAR | Status: DC | PRN
  Administered 2013-05-16: 0.4 mg via INTRAVENOUS

## 2013-05-16 MED ORDER — ONDANSETRON HCL 4 MG PO TABS: 4.0000 mg | ORAL_TABLET | Freq: Three times a day (TID) | ORAL | Status: DC | PRN

## 2013-05-16 MED ORDER — MIDAZOLAM HCL 2 MG/2ML IJ SOLN
INTRAMUSCULAR | Status: AC
  Filled 2013-05-16: qty 2

## 2013-05-16 MED ORDER — HYDROMORPHONE 0.3 MG/ML IV SOLN: INTRAVENOUS | Status: DC

## 2013-05-16 MED ORDER — CLONIDINE HCL 0.1 MG PO TABS
0.1000 mg | ORAL_TABLET | Freq: Two times a day (BID) | ORAL | Status: DC
  Administered 2013-05-16 – 2013-05-18 (×4): 0.1 mg via ORAL
  Filled 2013-05-16 (×5): qty 1

## 2013-05-16 MED ORDER — HYDROMORPHONE 0.3 MG/ML IV SOLN
INTRAVENOUS | Status: DC
  Administered 2013-05-16: 0.4 mg via INTRAVENOUS
  Administered 2013-05-16: 0.399 mg via INTRAVENOUS
  Administered 2013-05-17: 0.999 mg via INTRAVENOUS
  Administered 2013-05-17: 0.199 mg via INTRAVENOUS
  Administered 2013-05-17: 0.6 mg via INTRAVENOUS

## 2013-05-16 MED ORDER — ONDANSETRON HCL 4 MG/2ML IJ SOLN
INTRAMUSCULAR | Status: AC
  Filled 2013-05-16: qty 2

## 2013-05-16 MED ORDER — VALSARTAN-HYDROCHLOROTHIAZIDE 160-25 MG PO TABS: 1.0000 | ORAL_TABLET | Freq: Every day | ORAL | Status: DC

## 2013-05-16 MED ORDER — BUPIVACAINE HCL (PF) 0.25 % IJ SOLN
INTRAMUSCULAR | Status: AC
  Filled 2013-05-16: qty 30

## 2013-05-16 MED ORDER — SCOPOLAMINE 1 MG/3DAYS TD PT72: 1.0000 | MEDICATED_PATCH | TRANSDERMAL | Status: DC

## 2013-05-16 MED ORDER — INSULIN ASPART 100 UNIT/ML ~~LOC~~ SOLN: 0.0000 [IU] | Freq: Three times a day (TID) | SUBCUTANEOUS | Status: DC

## 2013-05-16 MED ORDER — MIDAZOLAM HCL 5 MG/5ML IJ SOLN
INTRAMUSCULAR | Status: DC | PRN
  Administered 2013-05-16: 2 mg via INTRAVENOUS

## 2013-05-16 MED ORDER — DOCUSATE SODIUM 100 MG PO CAPS
100.0000 mg | ORAL_CAPSULE | Freq: Two times a day (BID) | ORAL | Status: DC
  Administered 2013-05-16 – 2013-05-18 (×4): 100 mg via ORAL
  Filled 2013-05-16 (×4): qty 1

## 2013-05-16 MED ORDER — HYDROMORPHONE HCL PF 1 MG/ML IJ SOLN: 0.2500 mg | INTRAMUSCULAR | Status: DC | PRN

## 2013-05-16 MED ORDER — NEOSTIGMINE METHYLSULFATE 1 MG/ML IJ SOLN
INTRAMUSCULAR | Status: AC
  Filled 2013-05-16: qty 1

## 2013-05-16 MED ORDER — PROPOFOL 10 MG/ML IV EMUL
INTRAVENOUS | Status: AC
  Filled 2013-05-16: qty 20

## 2013-05-16 MED ORDER — PROPOFOL 10 MG/ML IV BOLUS
INTRAVENOUS | Status: DC | PRN
  Administered 2013-05-16: 200 ug via INTRAVENOUS

## 2013-05-16 MED ORDER — HYDROMORPHONE 0.3 MG/ML IV SOLN
INTRAVENOUS | Status: DC
  Administered 2013-05-16: 14:00:00 via INTRAVENOUS
  Filled 2013-05-16: qty 25

## 2013-05-16 MED ORDER — BUPIVACAINE HCL (PF) 0.25 % IJ SOLN
INTRAMUSCULAR | Status: DC | PRN
  Administered 2013-05-16: 20 mL
  Administered 2013-05-16: 10 mL

## 2013-05-16 MED ORDER — GLYCOPYRROLATE 0.2 MG/ML IJ SOLN
INTRAMUSCULAR | Status: AC
  Filled 2013-05-16: qty 2

## 2013-05-16 MED ORDER — HYDROMORPHONE HCL PF 1 MG/ML IJ SOLN
INTRAMUSCULAR | Status: AC
  Filled 2013-05-16: qty 1

## 2013-05-16 MED ORDER — ROCURONIUM BROMIDE 50 MG/5ML IV SOLN
INTRAVENOUS | Status: AC
  Filled 2013-05-16: qty 1

## 2013-05-16 MED ORDER — POLYSACCHARIDE IRON COMPLEX 150 MG PO CAPS
150.0000 mg | ORAL_CAPSULE | Freq: Every day | ORAL | Status: DC
  Administered 2013-05-16 – 2013-05-18 (×3): 150 mg via ORAL
  Filled 2013-05-16 (×3): qty 1

## 2013-05-16 MED ORDER — OXYCODONE-ACETAMINOPHEN 5-325 MG PO TABS
1.0000 | ORAL_TABLET | ORAL | Status: DC | PRN
  Administered 2013-05-17 – 2013-05-18 (×4): 1 via ORAL
  Filled 2013-05-16 (×5): qty 1

## 2013-05-16 MED ORDER — IRBESARTAN 150 MG PO TABS
150.0000 mg | ORAL_TABLET | Freq: Every day | ORAL | Status: DC
  Administered 2013-05-17: 150 mg via ORAL
  Filled 2013-05-16 (×2): qty 1

## 2013-05-16 MED ORDER — ROCURONIUM BROMIDE 100 MG/10ML IV SOLN
INTRAVENOUS | Status: DC | PRN
  Administered 2013-05-16: 10 mg via INTRAVENOUS
  Administered 2013-05-16: 40 mg via INTRAVENOUS
  Administered 2013-05-16: 10 mg via INTRAVENOUS

## 2013-05-16 MED ORDER — 0.9 % SODIUM CHLORIDE (POUR BTL) OPTIME
TOPICAL | Status: DC | PRN
  Administered 2013-05-16 (×3): 1000 mL

## 2013-05-16 MED ORDER — IBUPROFEN 600 MG PO TABS
600.0000 mg | ORAL_TABLET | Freq: Four times a day (QID) | ORAL | Status: DC | PRN
  Administered 2013-05-17 – 2013-05-18 (×4): 600 mg via ORAL
  Filled 2013-05-16 (×4): qty 1

## 2013-05-16 MED ORDER — GLIPIZIDE 5 MG PO TABS
5.0000 mg | ORAL_TABLET | Freq: Every day | ORAL | Status: DC
  Administered 2013-05-17 – 2013-05-18 (×2): 5 mg via ORAL
  Filled 2013-05-16 (×2): qty 1

## 2013-05-16 MED ORDER — AMLODIPINE BESYLATE 10 MG PO TABS
10.0000 mg | ORAL_TABLET | Freq: Every day | ORAL | Status: DC
  Administered 2013-05-16 – 2013-05-17 (×2): 10 mg via ORAL
  Filled 2013-05-16 (×3): qty 1

## 2013-05-16 MED ORDER — MEPERIDINE HCL 25 MG/ML IJ SOLN: 6.2500 mg | INTRAMUSCULAR | Status: DC | PRN

## 2013-05-16 MED ORDER — HYDROCHLOROTHIAZIDE 25 MG PO TABS
25.0000 mg | ORAL_TABLET | Freq: Every day | ORAL | Status: DC
  Administered 2013-05-17: 25 mg via ORAL
  Filled 2013-05-16 (×2): qty 1

## 2013-05-16 SURGICAL SUPPLY — 50 items
ADH SKN CLS APL DERMABOND .7 (GAUZE/BANDAGES/DRESSINGS)
BLADE HEX COATED 2.75 (ELECTRODE) IMPLANT
CANISTER SUCTION 2500CC (MISCELLANEOUS) ×2 IMPLANT
CATH ROBINSON RED A/P 16FR (CATHETERS) IMPLANT
CHLORAPREP W/TINT 26ML (MISCELLANEOUS) ×2 IMPLANT
CLOTH BEACON ORANGE TIMEOUT ST (SAFETY) ×2 IMPLANT
CONT PATH 16OZ SNAP LID 3702 (MISCELLANEOUS) ×2 IMPLANT
CONT SPECI 4OZ STER CLIK (MISCELLANEOUS) ×1 IMPLANT
DECANTER SPIKE VIAL GLASS SM (MISCELLANEOUS) IMPLANT
DERMABOND ADVANCED (GAUZE/BANDAGES/DRESSINGS)
DERMABOND ADVANCED .7 DNX12 (GAUZE/BANDAGES/DRESSINGS) IMPLANT
DRAIN JACKSON PRT FLT 7MM (DRAIN) IMPLANT
DRSG OPSITE POSTOP 4X10 (GAUZE/BANDAGES/DRESSINGS) ×1 IMPLANT
ELECT NDL TIP 2.8 STRL (NEEDLE) IMPLANT
ELECT NEEDLE TIP 2.8 STRL (NEEDLE) IMPLANT
EVACUATOR SILICONE 100CC (DRAIN) IMPLANT
GAUZE SPONGE 4X4 16PLY XRAY LF (GAUZE/BANDAGES/DRESSINGS) ×4 IMPLANT
GLOVE SURG SS PI 6.5 STRL IVOR (GLOVE) ×4 IMPLANT
GOWN PREVENTION PLUS LG XLONG (DISPOSABLE) ×6 IMPLANT
NDL HYPO 25X1 1.5 SAFETY (NEEDLE) IMPLANT
NDL SPNL 22GX3.5 QUINCKE BK (NEEDLE) ×1 IMPLANT
NEEDLE HYPO 25X1 1.5 SAFETY (NEEDLE) IMPLANT
NEEDLE SPNL 22GX3.5 QUINCKE BK (NEEDLE) ×2 IMPLANT
NS IRRIG 1000ML POUR BTL (IV SOLUTION) ×3 IMPLANT
PACK ABDOMINAL GYN (CUSTOM PROCEDURE TRAY) ×2 IMPLANT
PAD ABD 7.5X8 STRL (GAUZE/BANDAGES/DRESSINGS) ×1 IMPLANT
PAD OB MATERNITY 4.3X12.25 (PERSONAL CARE ITEMS) ×2 IMPLANT
PROTECTOR NERVE ULNAR (MISCELLANEOUS) ×2 IMPLANT
SPONGE LAP 18X18 X RAY DECT (DISPOSABLE) ×4 IMPLANT
STAPLER VISISTAT 35W (STAPLE) IMPLANT
SUT MNCRL AB 3-0 PS2 27 (SUTURE) ×1 IMPLANT
SUT PDS AB 1 CT  36 (SUTURE)
SUT PDS AB 1 CT 36 (SUTURE) IMPLANT
SUT PLAIN 2 0 XLH (SUTURE) IMPLANT
SUT SILK 0 FSL (SUTURE) IMPLANT
SUT VIC AB 0 CT1 18XCR BRD8 (SUTURE) ×3 IMPLANT
SUT VIC AB 0 CT1 27 (SUTURE) ×4
SUT VIC AB 0 CT1 27XBRD ANBCTR (SUTURE) IMPLANT
SUT VIC AB 0 CT1 8-18 (SUTURE) ×6
SUT VIC AB 2-0 CT1 (SUTURE) ×3 IMPLANT
SUT VIC AB 3-0 SH 27 (SUTURE) ×2
SUT VIC AB 3-0 SH 27X BRD (SUTURE) IMPLANT
SUT VICRYL 0 TIES 12 18 (SUTURE) ×2 IMPLANT
SYR 50ML LL SCALE MARK (SYRINGE) IMPLANT
SYR CONTROL 10ML LL (SYRINGE) IMPLANT
SYR TB 1ML 25GX5/8 (SYRINGE) IMPLANT
TAPE CLOTH SURG 4X10 WHT LF (GAUZE/BANDAGES/DRESSINGS) ×1 IMPLANT
TOWEL OR 17X24 6PK STRL BLUE (TOWEL DISPOSABLE) ×4 IMPLANT
TRAY FOLEY CATH 14FR (SET/KITS/TRAYS/PACK) ×2 IMPLANT
WATER STERILE IRR 1000ML POUR (IV SOLUTION) ×2 IMPLANT

## 2013-05-16 NOTE — Anesthesia Postprocedure Evaluation (Signed)
  Anesthesia Post-op Note  Patient: Amber Kelly  Procedure(s) Performed: Procedure(s): HYSTERECTOMY ABDOMINAL WITH BILATERAL SALPINGECTOMY (Bilateral)  Patient Location: Women's Unit  Anesthesia Type:General  Level of Consciousness: awake  Airway and Oxygen Therapy: Patient Spontanous Breathing  Post-op Pain: mild  Post-op Assessment: Patient's Cardiovascular Status Stable and Respiratory Function Stable  Post-op Vital Signs: stable  Complications: No apparent anesthesia complications

## 2013-05-16 NOTE — H&P (Signed)
Amber Kelly is a 52 y.o. female, P 2-0-1-2 who presents for hysterectomy because of symptomatic uterine fibroids and menorrhagia. Patient had normal monthly periods until October 2013 when she began to "flow like a faucet" requriing overnight pad change every 30 minutes then it gradually tapered to more spotting. She didn't notice any bleeding for about 3 weeks then resumed in November with the need to change a tampon with panty liner 3 times a day. Cramping with this bleeding was minimal compared to her usual cramps rated as 7/10 on a 10 point pain scale. Patient's TSH was normal hemoglobin was 10.9 so she began iron supplementation. A pelvic ultrasound in November 2013 showed: uterus-12.0 x 8.12 x 7.66 cm with an endometrium-0.77 cm. There were #2 measurable fibroids: right fundal-4.4 x 4.2 x 5.9 cm, right lower uterine segment-3.2 x 3.0 x 3.7 cm and several 2 cm myometrial fibroids seen left of the uterus midline. A sono-hysterogram at that time revealed a possible solid focus in the lower uterine segment with question of a submucosal fibroid however the procedure was technically difficult due to fundal cavity changes caused by fibroids and the posterior uterine angle. Both ovaries appeared normal on this study. An endometrial biopsy during this evaluation was benign. The patient admits to urinary frequency but denies urinary urgency, incontinence changes in bowel function, vaginitis symptoms or dyspareunia. The patient was placed on Provera 40 mg daily to manage her persistent bleeding. As long as she took the medication, her bleeding only required the use of a tampon with panty liner daily but if she did not take her medication as directed, the flow with increase. A review of both medical and surgical management options were given to the patient and after careful consideration she decided to proceed with definitive therapy in the form of hysterectomy.   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  amLODipine-benazepril (LOTREL) 10-40 MG per capsule  Take 1 capsule by mouth daily.     Marland Kitchen  CALCIUM PO  Take 1 tablet by mouth daily.     .  cloNIDine (CATAPRES) 0.1 MG tablet  Take 0.1 mg by mouth 2 (two) times daily.     .  cycloSPORINE (RESTASIS) 0.05 % ophthalmic emulsion  Place 1 drop into both eyes 2 (two) times daily.     Marland Kitchen  glipiZIDE (GLUCOTROL) 5 MG tablet  Take 5 mg by mouth daily before breakfast.     .  iron polysaccharides (NIFEREX) 150 MG capsule  Take 150 mg by mouth daily.     Marland Kitchen  KRILL OIL OMEGA-3 PO  Take 1 capsule by mouth daily. Mega Red     .  medroxyPROGESTERone (PROVERA) 10 MG tablet  Pt to take 4  tablets by mouth daily(40mg  total)  120 tablet  12   .  Multiple Vitamin (MULTIVITAMIN WITH MINERALS) TABS  Take 1 tablet by mouth daily.     .  valsartan-hydrochlorothiazide (DIOVAN-HCT) 160-25 MG per tablet  Take 1 tablet by mouth daily.      No facility-administered encounter medications on file as of 05/10/2013.   Diclofenac 75 mg prn  Ferex 150 mg daily  Ketoconazole 2% Shampoo prn  Lotemax 0.5% Eye Gel Drops daily    Denies sensitivity to peanuts, shellfish, soy, latex or adhesives.  (Pistachios will  increase her blood pressure)   No Known Allergies   ROS: Admits to glasses but denies headache, vision changes, nasal congestion, dysphagia, tinnitus, dizziness, hoarseness, cough, chest pain, shortness of breath, nausea, vomiting, diarrhea,constipation, urgency dysuria, hematuria, vaginitis symptoms, pelvic pain, swelling of joints,easy bruising, myalgias, arthralgias, skin rashes, unexplained weight loss and except as is mentioned in the history of present illness, patient's review of systems is otherwise negative.   Physical Exam   Bp 110/78 P 78 R 12 T 99.4 degrees F orally Weight 183 Height 5'4" BMI 31.4   Neck: supple without masses or thyromegaly  Lungs: clear to auscultation  Heart: regular rate and rhythm  Abdomen: soft, non-tender, with a firm mass from pelvis to mid-position between symphysis pubis and umbilicus; no organomegaly  Pelvic:EGBUS- wnl; vagina-normal rugae with scant blood; uterus-irregular, 14-16 week size, cervix without lesions or motion tenderness; adnexae-no tenderness or masses  Extremities: no clubbing, cyanosis or edema   Assesment: Symptomatic Uterine Fibroids                        Menorrhagia                        H/O Anemia   Disposition: A discussion was held with patient regarding the indication for her procedure(s) along with the risks, which include but are not limited to: reaction to anesthesia, damage to adjacent organs, infection and excessive bleeding. The patient verbalized understanding of these risks and has consented to proceed with a Total Abdominal Hysterectomy with Bilateral Salpingectomy at [facility] [place] on May 16, 2013 at 9:45 a.m.    CSN# [id number]   Elmira J. Lowell Guitar, PA-C for Dr. Maris Berger. Amber Kelly    History and Physical Interval Note:   05/16/2013   9:24 AM   Amber Kelly  has presented today for surgery, with the diagnosis of fibroids, menorrhagia, dysmenorrhea, anemia  CPT 58150   The various methods  of treatment have been discussed with the patient and family. After consideration of risks, benefits and other options for treatment, the patient has consented to  Procedure(s): HYSTERECTOMY ABDOMINAL as a surgical intervention .  I recommend and she accepts bilateral salpingectomy to attempt to decrease the risk of ovarian cancer.  I have reviewed the patients' chart and labs. FBS is improved at 157. Questions were answered to the patient's satisfaction.  She is ready to proceed.     Hal Morales  MD

## 2013-05-16 NOTE — Anesthesia Postprocedure Evaluation (Signed)
  Anesthesia Post-op Note  Patient: Amber Kelly  Procedure(s) Performed: Procedure(s): HYSTERECTOMY ABDOMINAL WITH BILATERAL SALPINGECTOMY (Bilateral)  Patient Location: PACU  Anesthesia Type:General  Level of Consciousness: awake, alert  and oriented  Airway and Oxygen Therapy: Patient Spontanous Breathing  Post-op Pain: mild  Post-op Assessment: Post-op Vital signs reviewed, Patient's Cardiovascular Status Stable, Respiratory Function Stable, Patent Airway, No signs of Nausea or vomiting and Pain level controlled  Post-op Vital Signs: Reviewed and stable  Complications: No apparent anesthesia complications

## 2013-05-16 NOTE — Anesthesia Preprocedure Evaluation (Addendum)
Anesthesia Evaluation  Patient identified by MRN, date of birth, ID band Patient awake    Reviewed: Allergy & Precautions, H&P , NPO status , Patient's Chart, lab work & pertinent test results  Airway Mallampati: II TM Distance: >3 FB Neck ROM: Full    Dental no notable dental hx. (+) Teeth Intact   Pulmonary neg pulmonary ROS,  breath sounds clear to auscultation  Pulmonary exam normal       Cardiovascular hypertension, Pt. on medications Rhythm:Regular Rate:Normal + Systolic murmurs II/VI SEM RUSB   Neuro/Psych negative neurological ROS  negative psych ROS   GI/Hepatic negative GI ROS, Neg liver ROS,   Endo/Other  diabetes, Well Controlled, Type 2, Oral Hypoglycemic AgentsObesity  Renal/GU   negative genitourinary   Musculoskeletal negative musculoskeletal ROS (+)   Abdominal Normal abdominal exam  (+) + obese,   Peds  Hematology negative hematology ROS (+)   Anesthesia Other Findings   Reproductive/Obstetrics Menorrhagia Uterine Fibroids Dysmenorrhea                         Anesthesia Physical Anesthesia Plan  ASA: III  Anesthesia Plan: General   Post-op Pain Management:    Induction: Intravenous  Airway Management Planned: Oral ETT  Additional Equipment:   Intra-op Plan:   Post-operative Plan: Extubation in OR  Informed Consent: I have reviewed the patients History and Physical, chart, labs and discussed the procedure including the risks, benefits and alternatives for the proposed anesthesia with the patient or authorized representative who has indicated his/her understanding and acceptance.   Dental advisory given  Plan Discussed with: CRNA, Anesthesiologist and Surgeon  Anesthesia Plan Comments:         Anesthesia Quick Evaluation

## 2013-05-16 NOTE — Op Note (Addendum)
05/16/2013  12:38 PM  PATIENT:  Amber Kelly  52 y.o. female MRN:  [id number]  PRE-OPERATIVE DIAGNOSIS:  fibroids, menorrhagia, dysmenorrhea, anemia  CPT 58150   POST-OPERATIVE DIAGNOSIS:  FIBROIDS,MENORRHAGIA,DYSMENORRHEA  PROCEDURE:  Procedure(s): HYSTERECTOMY ABDOMINAL WITH BILATERAL SALPINGECTOMY  SURGEON:  Surgeon(s): Hal Morales, MD  ASSISTANTS: Henreitta Leber certified physician assistant   ANESTHESIA:  General  ESTIMATED BLOOD LOSS: 250 cc  COMPLICATIONS: None  BLOOD ADMINISTERED:none  DRAINS: none   LOCAL MEDICATIONS USED:  MARCAINE    and Amount: 30 ml  SPECIMEN:  Source of Specimen:  The uterus, cervix, bilateral fallopian tubes  DISPOSITION OF SPECIMEN:  PATHOLOGY  COUNTS:  YES  FINDINGS:  Uterus was enlarged to about 14 wk size with multiple subserosal myomas.  The tubes were s/p tubal ligation but otherwise normal.  The ovaries were normal bilaterally.  PROCEDURE: The patient was taken to the operating room after appropriate identification and placed on the operating table in the supine position. Equipment for the induction of general anesthesia was placed and after the attainment of adequate general anesthesia the abdomen was prepped with chlor prep, the perineum, and vagina were prepped with multiple layers of Betadine.a Foley catheter was inserted into the bladder under sterile conditions and connected to straight drainage. The abdomen was draped as a sterile field.  Suprapubic injection of 20 cc of quarter percent Marcaine was undertaken. A suprapubic incision was made at the site of the previous cesarean section incision and the abdomen opened in layers. Peritoneum was entered and a self-retaining retractor placed in the abdominal cavity  After evaluation of the upper abdomen . A bladder blade was placed. After visual and palpable intraperitoneal evaluation of the anatomy was carried out large Kelly clamps were placed at the cornual regions of the  uterus. The left round ligament was then identified clamped cut and suture ligated. That incision was taken anteriorly on the anterior leaf of the broad ligament. The utero-ovarian ligament was identified clamped cut, free tied and suture-ligated. A similar procedure was carried out on the opposite side with the round ligament and utero-ovarian ligament. The bladder flap was completed on the opposite side with incision of the anterior leaf of the broad ligament. The bladder was dissected off the anterior cervix with a combination of blunt and sharp dissection. it was slightly more adherent because of the patient's previous cesarean section. The uterine arteries on the right and left side were  skeletonized, then clamped cut and suture ligated.  Parametial tissues on the right and left side were clamped, cut,  clamped and suture-ligated down to the level of the cervix.  the uterine fundus was excised and removed from the operative field. The paracervical tissues were then clamped cut and suture ligated. Uterosacral ligaments were clamped cut suture-ligated and those sutures held. The vaginal angles were clamped cut suture ligated and the sutures held. The remainder of the vagina was incised and the  cervix  removed from the operative field. Vaginal cuff was closed with figure-of-eight sutures of 0 Vicryl. Copious irrigation was carried out. The sutures holding the vaginal angles and uterosacral ligaments were then tied together. The right fallopian tube was elevated with a Babcock clamp.  The uterotubal junction was  successively clamped, cut, and suture ligated to allow removal of the right fallopian tube. A similar procedure was carried out to achieve salpingectomy on the opposite side. Hemostasis was noted to be adequate and all instruments were removed from the peritoneal cavity . After copious  irrigation.   The abdominal peritoneum was closed using running suture of 2-0 Vicryl.  The rectus fascia was closed  with running sutures of 0 Vicryl  from each apex to  the midline and tied in the midline. The subcutaneous tissue was made hemostatic with Bovie cautery and irrigated. subcuticular sutures of 0 Vicryl were used to reapproximate the skin edges.  The skin incision was closed with a subcuticular suture of 3-0 Monocryl. Sterile dressing was applied. The patient was awakened from general anesthesia and taken to the recovery room in satisfactory condition having tolerated the procedure well with  sponge and instrument counts correct.  PLAN OF CARE: admit to women's unit   PATIENT DISPOSITION:  PACU - hemodynamically stable.   Delay start of Pharmacological VTE agent (>24hrs) due to surgical blood loss or risk of bleeding:  not applicable.  SCDs were used throughout surgical and will be continued. Postoperative   Chidubem Chaires P 12:38 PM

## 2013-05-16 NOTE — Transfer of Care (Signed)
Immediate Anesthesia Transfer of Care Note  Patient: Amber Kelly  Procedure(s) Performed: Procedure(s): HYSTERECTOMY ABDOMINAL WITH BILATERAL SALPINGECTOMY (Bilateral)  Patient Location: PACU  Anesthesia Type:General  Level of Consciousness: awake, alert  and oriented  Airway & Oxygen Therapy: Patient Spontanous Breathing and Patient connected to nasal cannula oxygen  Post-op Assessment: Report given to PACU RN and Post -op Vital signs reviewed and stable  Post vital signs: stable  Complications: No apparent anesthesia complications

## 2013-05-16 NOTE — Progress Notes (Signed)
Day of Surgery Procedure(s) (LRB): HYSTERECTOMY ABDOMINAL WITH BILATERAL SALPINGECTOMY (Bilateral)  Subjective: Patient reports no nausea, vomiting and tolerating PO.    Objective: I have reviewed patient's vital signs, intake and output and medications.  General: alert, cooperative, appears stated age, no distress and mildly obese Resp: clear to auscultation bilaterally Cardio: regular rate and rhythm, S1, S2 normal, no murmur, click, rub or gallop GI: soft, non-tender; bowel sounds normal; no masses,  no organomegaly Extremities: extremities normal, atraumatic, no cyanosis or edema Vaginal Bleeding: none I/O last 3 completed shifts: In: 2820 [P.O.:120; I.V.:2700] Out: 1250 [Urine:1000; Blood:250] Total I/O In: 969.8 [I.V.:969.8] Out: 300 [Urine:300]  Results for orders placed during the hospital encounter of 05/16/13 (from the past 24 hour(s))  GLUCOSE, CAPILLARY     Status: Abnormal   Collection Time    05/16/13  8:51 AM      Result Value Range   Glucose-Capillary 157 (*) 70 - 99 mg/dL  GLUCOSE, CAPILLARY     Status: Abnormal   Collection Time    05/16/13 10:52 AM      Result Value Range   Glucose-Capillary 131 (*) 70 - 99 mg/dL  GLUCOSE, CAPILLARY     Status: Abnormal   Collection Time    05/16/13 12:41 PM      Result Value Range   Glucose-Capillary 139 (*) 70 - 99 mg/dL  GLUCOSE, CAPILLARY     Status: Abnormal   Collection Time    05/16/13  5:10 PM      Result Value Range   Glucose-Capillary 132 (*) 70 - 99 mg/dL  GLUCOSE, CAPILLARY     Status: Abnormal   Collection Time    05/16/13  9:47 PM      Result Value Range   Glucose-Capillary 122 (*) 70 - 99 mg/dL      Assessment: s/p Procedure(s): HYSTERECTOMY ABDOMINAL WITH BILATERAL SALPINGECTOMY (Bilateral): stable, progressing well and tolerating diet  Plan: Advance diet Encourage ambulation Advance to PO medication  LOS: 0 days    Correy Weidner P 05/16/2013, 11:00 PM

## 2013-05-17 ENCOUNTER — Encounter (HOSPITAL_COMMUNITY): Payer: Self-pay | Admitting: Obstetrics and Gynecology

## 2013-05-17 LAB — CBC
MCH: 26.9 pg (ref 26.0–34.0)
Platelets: 260 10*3/uL (ref 150–400)
RBC: 3.86 MIL/uL — ABNORMAL LOW (ref 3.87–5.11)
RDW: 13.1 % (ref 11.5–15.5)
WBC: 9.6 10*3/uL (ref 4.0–10.5)

## 2013-05-17 LAB — GLUCOSE, CAPILLARY: Glucose-Capillary: 90 mg/dL (ref 70–99)

## 2013-05-17 MED ORDER — INSULIN ASPART 100 UNIT/ML ~~LOC~~ SOLN
3.0000 [IU] | Freq: Once | SUBCUTANEOUS | Status: AC
  Administered 2013-05-17: 3 [IU] via SUBCUTANEOUS

## 2013-05-17 MED FILL — Heparin Sodium (Porcine) Inj 5000 Unit/ML: INTRAMUSCULAR | Qty: 1 | Status: AC

## 2013-05-17 NOTE — Progress Notes (Signed)
Amber Kelly is a52 y.o.  [id number]  Post Op Date # 1;  TAH/BS  Subjective: Patient is Doing well postoperatively. Patient has good pain control with PCA, ambulated with minimal lightheadedness, tolerating sips and ice, has passed flatus but has not voided yet (Foley removed a hour ago).,   Objective: Vital signs in last 24 hours: Temp:  [97.7 F (36.5 C)-99.2 F (37.3 C)] 98.4 F (36.9 C) (06/25 0529) Pulse Rate:  [71-89] 71 (06/25 0529) Resp:  [12-20] 16 (06/25 0529) BP: (133-171)/(71-89) 143/78 mmHg (06/25 0529) SpO2:  [98 %-100 %] 100 % (06/25 0529) Weight:  [183 lb (83.008 kg)] 183 lb (83.008 kg) (06/24 1400)  Intake/Output from previous day: 06/24 0701 - 06/25 0700 In: 4758.1 [P.O.:120; I.V.:4638.1] Out: 2700 [Urine:2450] Intake/Output this shift:    Recent Labs Lab 05/11/13 1023 05/17/13 0507  WBC 7.8 9.6  HGB 12.2 10.4*  HCT 36.4 31.5*  PLT 294 260     Recent Labs Lab 05/11/13 1023  NA 139  K 3.2*  CL 101  CO2 28  BUN 8  CREATININE 0.63  CALCIUM 9.7  GLUCOSE 229*    EXAM: General: alert, cooperative and no distress Resp: clear to auscultation bilaterally Cardio: regular rate and rhythm, S1, S2 normal, no murmur, click, rub or gallop GI: Bowel sounds present in all quadrants, dressing is intact, clean and dry. Extremities: SCD hose in place and functioning, no calf tenderness and negative Homan's   Assessment: s/p Procedure(s): HYSTERECTOMY ABDOMINAL WITH BILATERAL SALPINGECTOMY: progressing well and anemia  Plan: Advance diet Encourage ambulation Advance to PO medication Once patient is eating, will discontinue sliding scale insulin protocol and begin oral diabetes medication. Routine care  LOS: 1 day    Elyjah Hazan, PA-C 05/17/2013 7:25 AM

## 2013-05-17 NOTE — Progress Notes (Signed)
1 Day Post-Op Procedure(s) (LRB): HYSTERECTOMY ABDOMINAL WITH BILATERAL SALPINGECTOMY (Bilateral)  Subjective: Patient reports No nausea, vomiting, incisional pain, tolerating PO, + flatus and no problems voiding.    Objective: I have reviewed patient's vital signs, intake and output, medications and labs.  General: alert, cooperative, no distress and moderately obese Resp: clear to auscultation bilaterally Cardio: regular rate and rhythm, S1, S2 normal, no murmur, click, rub or gallop GI: soft, non-tender; bowel sounds normal; no masses,  no organomegaly and incision: dressing dry Extremities: extremities normal, atraumatic, no cyanosis or edema Vaginal Bleeding: none cbg's all <100 Assessment: s/p Procedure(s): HYSTERECTOMY ABDOMINAL WITH BILATERAL SALPINGECTOMY (Bilateral): stable and progressing well  Plan: Advance diet Encourage ambulation Advance to PO medication Discontinue IV fluids Discharge home on 05/18/13  LOS: 1 day    Amber Kelly P 05/17/2013, 1:23 PM

## 2013-05-18 DIAGNOSIS — E876 Hypokalemia: Secondary | ICD-10-CM

## 2013-05-18 DIAGNOSIS — E119 Type 2 diabetes mellitus without complications: Secondary | ICD-10-CM | POA: Diagnosis present

## 2013-05-18 LAB — GLUCOSE, CAPILLARY: Glucose-Capillary: 117 mg/dL — ABNORMAL HIGH (ref 70–99)

## 2013-05-18 LAB — BASIC METABOLIC PANEL
CO2: 27 mEq/L (ref 19–32)
Chloride: 101 mEq/L (ref 96–112)
Creatinine, Ser: 0.63 mg/dL (ref 0.50–1.10)
GFR calc Af Amer: >90 mL/min (ref >90)
Potassium: 3.8 mEq/L (ref 3.5–5.1)

## 2013-05-18 MED ORDER — IBUPROFEN 600 MG PO TABS: 600.0000 mg | ORAL_TABLET | Freq: Four times a day (QID) | ORAL | Status: DC | PRN

## 2013-05-18 MED ORDER — ONDANSETRON HCL 4 MG PO TABS: 4.0000 mg | ORAL_TABLET | Freq: Three times a day (TID) | ORAL | Status: DC | PRN

## 2013-05-18 MED ORDER — OXYCODONE-ACETAMINOPHEN 5-325 MG PO TABS: 1.0000 | ORAL_TABLET | ORAL | Status: DC | PRN

## 2013-05-18 MED ORDER — POTASSIUM CHLORIDE ER 10 MEQ PO TBCR: 10.0000 meq | EXTENDED_RELEASE_TABLET | Freq: Every day | ORAL | Status: DC

## 2013-05-18 NOTE — Progress Notes (Signed)
Amber Kelly is a52 y.o.  [id number]  Post Op Date #2;  TAH/BS  Subjective: Patient is Doing well postoperatively. Patient has Pain is not well controlled.  Medications being used: ibuprofen (OTC) and narcotic analgesics including Percocet... Continues to tolerate modified carbohydrate diet, ambulate and void without difficulty.   Objective: Vital signs in last 24 hours: Temp:  [98 F (36.7 C)-99.2 F (37.3 C)] 98 F (36.7 C) (06/26 0518) Pulse Rate:  [65-70] 65 (06/26 0518) Resp:  [16-18] 16 (06/26 0518) BP: (114-142)/(70-84) 138/70 mmHg (06/26 0518) SpO2:  [95 %-100 %] 99 % (06/26 0518)  Intake/Output from previous day: 06/25 0701 - 06/26 0700 In: 1190.4 [P.O.:480; I.V.:710.4] Out: 1250 [Urine:1250] Intake/Output this shift:    Recent Labs Lab 05/11/13 1023 05/17/13 0507  WBC 7.8 9.6  HGB 12.2 10.4*  HCT 36.4 31.5*  PLT 294 260     Recent Labs Lab 05/11/13 1023  NA 139  K 3.2*  CL 101  CO2 28  BUN 8  CREATININE 0.63  CALCIUM 9.7  GLUCOSE 229*    EXAM: General: alert, cooperative and no distress Resp: clear to auscultation bilaterally Cardio: regular rate and rhythm, S1, S2 normal, no murmur, click, rub or gallop GI: soft, bowel sounds are present, incision with dried stain on honeycomb dressing but it is intact. Extremities: No calf tenderness   Assessment: s/p Procedure(s): HYSTERECTOMY ABDOMINAL WITH BILATERAL SALPINGECTOMY: stable, progressing well   anemia hypokalemia  Plan: Discharge home  LOS: 2 days    POWELL,ELMIRA, PA-C 05/18/2013 7:08 AM

## 2013-05-18 NOTE — Discharge Instructions (Signed)
Call [facility] @ [phone] if:  You have a temperature greater than or equal to 100.4 degrees Farenheit orally You have pain that is not made better by the pain medication given and taken as directed You have excessive bleeding or problems urinating  Take Colace (Docusate Sodium/Stool Softener) 100 mg 2-3 times daily while taking narcotic pain medicine to avoid constipation or until bowel movements are regular. Take your Ibuprofen 600 mg with food every 6 hours for 5 days then as needed for pain  You may drive after 1 week You may walk up steps You may shower  You may resume a regular modified carbohydrate diet Keep incisions clean and dry Do not lift over 15 pounds for 6 weeks Avoid anything in vagina for 6 weeks (or until after your post-operative visit)

## 2013-05-19 NOTE — Discharge Summary (Signed)
Physician Discharge Summary  Patient ID: Amber Kelly MRN: [id number] DOB/AGE: 1961/08/16 52 y.o.  Admit date: 05/16/2013 Discharge date: 05/19/2013  Admission Diagnoses: Uterine fibroids.     Menorrhagia.     History of anemia.     Dysmenorrhea.     Obesity.     Type 2 diabetes.     Hypertension.     Hypokalemia  Discharge Diagnoses:  Principal Problem:   Fibroids Active Problems:   Hypertension   Menorrhagia   Dysmenorrhea   Obesity (BMI 30.0-34.9)   Anemia   Type II or unspecified type diabetes mellitus without mention of complication, not stated as uncontrolled   Discharged Condition: good  Hospital Course: The patient was admitted to the hospital via the recovery room after undergoing a total abdominal hysterectomy and bilateral salpingectomy. She did well postoperatively with rapid return of gastrointestinal and urinary function. She had good control of her blood pressure and blood sugars during her hospitalization. By postoperative day 1 she moved to a regular diet and oral pain medications. On postoperative day 2. She was deemed to have received maximal hospital benefit and be ready for discharge home.  Consults: None  Significant Diagnostic Studies: labs: CBC, capillary blood glucoses, basic metabolic panel  Treatments: IV hydration, antibiotics: Cefotetan and as operative prophylaxis, insulin: Humalog as needed. For elevated blood sugars on the day of surgery.  Discharge Exam: Blood pressure 115/72, pulse 71, temperature 98.7 F (37.1 C), temperature source Oral, resp. rate 18, height 5\' 4"  (1.626 m), weight 183 lb (83.008 kg), last menstrual period 05/09/2013, SpO2 100.00%. General appearance: alert, cooperative, appears stated age, no distress and mildly obese Resp: clear to auscultation bilaterally Chest wall: no tenderness GI: soft, non-tender; bowel sounds normal; no masses,  no organomegaly Extremities: extremities normal, atraumatic, no cyanosis or  edema Incision/Wound: dressing dry and intact  Results for orders placed during the hospital encounter of 05/16/13 (from the past 48 hour(s))  GLUCOSE, CAPILLARY     Status: None   Collection Time    05/17/13 12:37 PM      Result Value Range   Glucose-Capillary 73  70 - 99 mg/dL  GLUCOSE, CAPILLARY     Status: None   Collection Time    05/17/13  6:18 PM      Result Value Range   Glucose-Capillary 81  70 - 99 mg/dL  GLUCOSE, CAPILLARY     Status: Abnormal   Collection Time    05/17/13  9:18 PM      Result Value Range   Glucose-Capillary 153 (*) 70 - 99 mg/dL  GLUCOSE, CAPILLARY     Status: None   Collection Time    05/17/13 11:25 PM      Result Value Range   Glucose-Capillary 90  70 - 99 mg/dL  GLUCOSE, CAPILLARY     Status: Abnormal   Collection Time    05/18/13  8:09 AM      Result Value Range   Glucose-Capillary 117 (*) 70 - 99 mg/dL   Comment 1 Notify RN    BASIC METABOLIC PANEL     Status: Abnormal   Collection Time    05/18/13  8:20 AM      Result Value Range   Sodium 138  135 - 145 mEq/L   Potassium 3.8  3.5 - 5.1 mEq/L   Chloride 101  96 - 112 mEq/L   CO2 27  19 - 32 mEq/L   Glucose, Bld 120 (*) 70 - 99 mg/dL  BUN 6  6 - 23 mg/dL   Creatinine, Ser 1.61  0.50 - 1.10 mg/dL   Calcium 8.7  8.4 - 09.6 mg/dL   GFR calc non Af Amer >90  >90 mL/min   GFR calc Af Amer >90  >90 mL/min   Comment:            The eGFR has been calculated     using the CKD EPI equation.     This calculation has not been     validated in all clinical     situations.     eGFR's persistently     <90 mL/min signify     possible Chronic Kidney Disease.    Disposition: 01-Home or Self Care     Medication List    STOP taking these medications       medroxyPROGESTERone 10 MG tablet  Commonly known as:  PROVERA      TAKE these medications       amLODipine-benazepril 10-40 MG per capsule  Commonly known as:  LOTREL  Take 1 capsule by mouth daily.     CALCIUM PO  Take 1 tablet  by mouth daily.     cloNIDine 0.1 MG tablet  Commonly known as:  CATAPRES  Take 0.1 mg by mouth 2 (two) times daily.     cycloSPORINE 0.05 % ophthalmic emulsion  Commonly known as:  RESTASIS  Place 1 drop into both eyes 2 (two) times daily.     glipiZIDE 5 MG tablet  Commonly known as:  GLUCOTROL  Take 5 mg by mouth daily before breakfast.     ibuprofen 600 MG tablet  Commonly known as:  ADVIL,MOTRIN  Take 1 tablet (600 mg total) by mouth every 6 (six) hours as needed (mild pain).     iron polysaccharides 150 MG capsule  Commonly known as:  NIFEREX  Take 150 mg by mouth daily.     KRILL OIL OMEGA-3 PO  Take 1 capsule by mouth daily. Mega Red     multivitamin with minerals Tabs  Take 1 tablet by mouth daily.     ondansetron 4 MG tablet  Commonly known as:  ZOFRAN  Take 1 tablet (4 mg total) by mouth every 8 (eight) hours as needed.     oxyCODONE-acetaminophen 5-325 MG per tablet  Commonly known as:  PERCOCET/ROXICET  Take 1-2 tablets by mouth every 4 (four) hours as needed.     potassium chloride 10 MEQ tablet  Commonly known as:  K-DUR  Take 1 tablet (10 mEq total) by mouth daily.     valsartan-hydrochlorothiazide 160-25 MG per tablet  Commonly known as:  DIOVAN-HCT  Take 1 tablet by mouth daily.           Follow-up Information   Follow up with Hal Morales, MD On 06/27/2013. (Appointment time is 10:15 a.m.)    Contact information:   [address] [place] [address] [phone]       Signed: Hal Morales 05/19/2013, 10:19 AM

## 2013-09-28 ENCOUNTER — Other Ambulatory Visit: Payer: Self-pay

## 2013-10-30 ENCOUNTER — Other Ambulatory Visit (HOSPITAL_COMMUNITY): Payer: Self-pay | Admitting: Family Medicine

## 2013-10-30 DIAGNOSIS — Z1231 Encounter for screening mammogram for malignant neoplasm of breast: Secondary | ICD-10-CM

## 2013-11-22 ENCOUNTER — Ambulatory Visit (HOSPITAL_COMMUNITY)
Admission: RE | Admit: 2013-11-22 | Discharge: 2013-11-22 | Disposition: A | Discharge status: Home / Self Care | Payer: BC Managed Care – PPO | Source: Ambulatory Visit | Attending: Family Medicine | Admitting: Family Medicine

## 2013-11-22 DIAGNOSIS — Z1231 Encounter for screening mammogram for malignant neoplasm of breast: Secondary | ICD-10-CM | POA: Insufficient documentation

## 2014-07-22 ENCOUNTER — Encounter: Payer: Self-pay | Admitting: *Deleted

## 2014-09-24 ENCOUNTER — Encounter: Payer: Self-pay | Admitting: *Deleted

## 2014-12-10 ENCOUNTER — Other Ambulatory Visit: Payer: Self-pay | Admitting: Gastroenterology

## 2015-05-20 ENCOUNTER — Other Ambulatory Visit: Payer: Self-pay

## 2015-06-12 ENCOUNTER — Other Ambulatory Visit (HOSPITAL_COMMUNITY): Payer: Self-pay | Admitting: Family Medicine

## 2015-06-12 DIAGNOSIS — Z1231 Encounter for screening mammogram for malignant neoplasm of breast: Secondary | ICD-10-CM

## 2015-06-13 ENCOUNTER — Ambulatory Visit (HOSPITAL_COMMUNITY)
Admission: RE | Admit: 2015-06-13 | Discharge: 2015-06-13 | Disposition: A | Discharge status: Home / Self Care | Payer: BC Managed Care – PPO | Source: Ambulatory Visit | Attending: Family Medicine | Admitting: Family Medicine

## 2015-06-13 DIAGNOSIS — Z1231 Encounter for screening mammogram for malignant neoplasm of breast: Secondary | ICD-10-CM | POA: Diagnosis present

## 2015-06-18 ENCOUNTER — Other Ambulatory Visit: Payer: Self-pay | Admitting: Family Medicine

## 2015-06-18 DIAGNOSIS — R928 Other abnormal and inconclusive findings on diagnostic imaging of breast: Secondary | ICD-10-CM

## 2015-06-20 ENCOUNTER — Ambulatory Visit
Admission: RE | Admit: 2015-06-20 | Discharge: 2015-06-20 | Disposition: A | Discharge status: Home / Self Care | Payer: BC Managed Care – PPO | Source: Ambulatory Visit | Attending: Family Medicine | Admitting: Family Medicine

## 2015-06-20 DIAGNOSIS — R928 Other abnormal and inconclusive findings on diagnostic imaging of breast: Secondary | ICD-10-CM

## 2016-06-01 ENCOUNTER — Other Ambulatory Visit: Payer: Self-pay | Admitting: Family Medicine

## 2016-06-01 DIAGNOSIS — Z1231 Encounter for screening mammogram for malignant neoplasm of breast: Secondary | ICD-10-CM

## 2016-06-17 ENCOUNTER — Ambulatory Visit
Admission: RE | Admit: 2016-06-17 | Discharge: 2016-06-17 | Disposition: A | Discharge status: Home / Self Care | Payer: BC Managed Care – PPO | Source: Ambulatory Visit | Attending: Family Medicine | Admitting: Family Medicine

## 2016-06-17 DIAGNOSIS — Z1231 Encounter for screening mammogram for malignant neoplasm of breast: Secondary | ICD-10-CM

## 2016-06-18 DIAGNOSIS — E119 Type 2 diabetes mellitus without complications: Secondary | ICD-10-CM | POA: Insufficient documentation

## 2019-08-17 ENCOUNTER — Telehealth: Payer: Self-pay | Admitting: *Deleted

## 2019-08-17 ENCOUNTER — Other Ambulatory Visit: Payer: Self-pay | Admitting: *Deleted

## 2019-08-17 DIAGNOSIS — R002 Palpitations: Secondary | ICD-10-CM

## 2019-08-17 NOTE — Telephone Encounter (Signed)
14 day ZIO XT long term holter monitor to be mailed to the patients home.  Instructions reviewed briefly as they are included in the monitor kit. 

## 2019-08-27 ENCOUNTER — Ambulatory Visit (INDEPENDENT_AMBULATORY_CARE_PROVIDER_SITE_OTHER): Payer: BC Managed Care – PPO

## 2019-08-27 DIAGNOSIS — R002 Palpitations: Secondary | ICD-10-CM

## 2019-10-06 ENCOUNTER — Other Ambulatory Visit: Payer: Self-pay | Admitting: Physician Assistant

## 2019-10-06 ENCOUNTER — Other Ambulatory Visit (HOSPITAL_COMMUNITY): Payer: Self-pay | Admitting: Physician Assistant

## 2019-10-06 DIAGNOSIS — R7989 Other specified abnormal findings of blood chemistry: Secondary | ICD-10-CM

## 2019-10-06 DIAGNOSIS — R002 Palpitations: Secondary | ICD-10-CM

## 2019-10-16 ENCOUNTER — Encounter (HOSPITAL_COMMUNITY)
Admission: RE | Admit: 2019-10-16 | Discharge: 2019-10-16 | Disposition: A | Discharge status: Home / Self Care | Payer: BC Managed Care – PPO | Source: Ambulatory Visit | Attending: Physician Assistant | Admitting: Physician Assistant

## 2019-10-16 ENCOUNTER — Other Ambulatory Visit: Payer: Self-pay

## 2019-10-16 DIAGNOSIS — R7989 Other specified abnormal findings of blood chemistry: Secondary | ICD-10-CM | POA: Diagnosis present

## 2019-10-16 DIAGNOSIS — R002 Palpitations: Secondary | ICD-10-CM | POA: Diagnosis present

## 2019-10-16 IMAGING — NM NM THYROID IMAGING W/ UPTAKE MULTI (4&24 HR)
4 series · 4 of 4 positions shown · non-contrast
Comparison: None

CLINICAL DATA: Palpitations, abnormal TSH 0.14, diabetes mellitus,
hypertension

EXAM:
THYROID SCAN AND UPTAKE - 4 AND 24 HOURS
TECHNIQUE: Following oral administration of [JR] capsule, anterior planar
imaging was acquired at 24 hours. Thyroid uptake was calculated with
a thyroid probe at 4-6 hours and 24 hours.
RADIOPHARMACEUTICALS:  470 uCi [JR] sodium iodide p.o.

[iu thyroid uptake i123 · 3.10mm/px · 1 of 1 slices shown (1 of 4)]
[im 1/1  full-range]
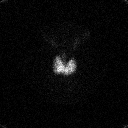

[iu thyroid uptake i123 · 3.10mm/px · 1 of 1 slices shown (2 of 4)]
[im 1/1  full-range]
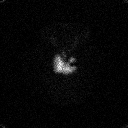

[iu thyroid uptake i123 · 3.10mm/px · 1 of 1 slices shown (3 of 4)]
[im 1/1]
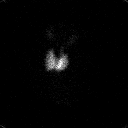

[iu thyroid uptake i123 · 3.10mm/px · 1 of 1 slices shown (4 of 4)]
[im 1/1  full-range]
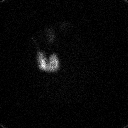

[4 of 4 positions shown; findings below may reference images not displayed]

FINDINGS: Homogeneous tracer distribution in both thyroid lobes.

Small pyramidal lobe noted.

No focal areas of increased or decreased tracer localization.

4 hour [JR] uptake = 6.5% (normal 5-20%)

24 hour [JR] uptake = 15.6% (normal 10-30%)
IMPRESSION: Normal 4 hour and 24 hour radio iodine uptakes.

No focal scintigraphic abnormalities identified in either thyroid
lobe.

Incidentally noted small pyramidal lobe.

Please select correct template:

## 2019-10-17 ENCOUNTER — Encounter (HOSPITAL_COMMUNITY)
Admission: RE | Admit: 2019-10-17 | Discharge: 2019-10-17 | Disposition: A | Discharge status: Home / Self Care | Payer: BC Managed Care – PPO | Source: Ambulatory Visit | Attending: Physician Assistant | Admitting: Physician Assistant

## 2019-11-28 ENCOUNTER — Encounter: Payer: Self-pay | Admitting: Interventional Cardiology

## 2019-11-28 ENCOUNTER — Telehealth: Payer: Self-pay | Admitting: *Deleted

## 2019-11-28 ENCOUNTER — Ambulatory Visit: Payer: BC Managed Care – PPO | Admitting: Interventional Cardiology

## 2019-11-28 ENCOUNTER — Other Ambulatory Visit: Payer: Self-pay

## 2019-11-28 VITALS — BP 160/100 | HR 78 | Ht 64.0 in | Wt 172.8 lb

## 2019-11-28 DIAGNOSIS — I471 Supraventricular tachycardia: Secondary | ICD-10-CM | POA: Diagnosis not present

## 2019-11-28 DIAGNOSIS — I10 Essential (primary) hypertension: Secondary | ICD-10-CM

## 2019-11-28 DIAGNOSIS — E669 Obesity, unspecified: Secondary | ICD-10-CM | POA: Diagnosis not present

## 2019-11-28 DIAGNOSIS — G4730 Sleep apnea, unspecified: Secondary | ICD-10-CM | POA: Diagnosis not present

## 2019-11-28 MED ORDER — CARVEDILOL 6.25 MG PO TABS: 6.2500 mg | ORAL_TABLET | Freq: Two times a day (BID) | ORAL | 3 refills | Status: DC

## 2019-11-28 NOTE — Telephone Encounter (Signed)
Per Merri Brunette with [employer] @ 4:29 pm no PA is required for sleep study. [phone]) message sent to Gae Bon ok to schedule.

## 2019-11-28 NOTE — Progress Notes (Signed)
Cardiology Office Note   Date:  11/28/2019   ID:  Amber Kelly, DOB 1960-12-01, MRN [id number]  PCP:  Amber Odor, PA-C    No chief complaint on file.  palpitations  Wt Readings from Last 3 Encounters:  11/28/19 172 lb 12.8 oz (78.4 kg)  05/16/13 183 lb (83 kg)  05/11/13 179 lb (81.2 kg)       History of Present Illness: Amber Kelly is a 59 y.o. female  Who has had palpitations.  Monitor in 09/2019 showed:  "Normal sinus rhythm  PAC's and rare PVC's are moted  Brief SVT with longest 12 beats  Symptoms correlate with brief SVT and premature beats.  No atrial fibrillation or VT noted   Symptoms correlated with premature beats and brief SVT"  Since November 2020, she has felt well.  Since the monitor, she has been more strict with her diabetic diet and this seemed to helpo with her palpitations.  Her A1C wsa 7.4 on 09/22/2019.  She has had more dietary indiscretion in Nov and DEc 2020.  SHe has had more palpitations over the past week.  She walks regularly and has no sx with walking.  Palpitations occur when she is lying in bed at night.  She thinks she may have sleep apnea. Her husband has seen episodes of apnea at night.     Past Medical History:  Diagnosis Date  . BV (bacterial vaginosis)   . Diabetes mellitus ([place])   . Dysmenorrhea   . Fibroids   . Hypertension   . Menorrhagia     Past Surgical History:  Procedure Laterality Date  . ABDOMINAL HYSTERECTOMY Bilateral 05/16/2013   Procedure: HYSTERECTOMY ABDOMINAL WITH BILATERAL SALPINGECTOMY;  Surgeon: Eldred Manges, MD;  Location: [facility];  Service: Gynecology;  Laterality: Bilateral;  . CESAREAN SECTION    . TUBAL LIGATION  1996     Current Outpatient Medications  Medication Sig Dispense Refill  . amLODipine-benazepril (LOTREL) 10-40 MG per capsule Take 1 capsule by mouth daily.    Marland Kitchen atorvastatin (LIPITOR) 10 MG tablet Take 10 mg by mouth daily.    Marland Kitchen CALCIUM PO Take 1 tablet by mouth  daily.    . cloNIDine (CATAPRES) 0.1 MG tablet Take 0.1 mg by mouth 2 (two) times daily.    . cycloSPORINE (RESTASIS) 0.05 % ophthalmic emulsion Place 1 drop into both eyes 2 (two) times daily.    Marland Kitchen JARDIANCE 10 MG TABS tablet Take 10 mg by mouth daily.    Marland Kitchen KRILL OIL OMEGA-3 PO Take 1 capsule by mouth daily. Mega Red    . losartan-hydrochlorothiazide (HYZAAR) 100-25 MG tablet Take 1 tablet by mouth every morning.    . metFORMIN (GLUCOPHAGE) 500 MG tablet Take 1,000 mg by mouth 2 (two) times daily.    . Multiple Vitamin (MULTIVITAMIN WITH MINERALS) TABS Take 1 tablet by mouth daily.     No current facility-administered medications for this visit.    Allergies:   Patient has no known allergies.    Social History:  The patient  reports that she has never smoked. She has never used smokeless tobacco. She reports that she does not drink alcohol or use drugs.   Family History:  The patient's family history includes Diabetes in her father; Hypertension in her mother.    ROS:  Please see the history of present illness.   Otherwise, review of systems are positive for palpitations.   All other systems are reviewed and negative.  PHYSICAL EXAM: VS:  BP (!) 160/100   Pulse 78   Ht 5\' 4"  (1.626 m)   Wt 172 lb 12.8 oz (78.4 kg)   LMP 05/09/2013   SpO2 98%   BMI 29.66 kg/m  , BMI Body mass index is 29.66 kg/m. GEN: Well nourished, well developed, in no acute distress  HEENT: normal  Neck: no JVD, carotid bruits, or masses Cardiac: RRR; no murmurs, rubs, or gallops,no edema  Respiratory:  clear to auscultation bilaterally, normal work of breathing GI: soft, nontender, nondistended, + BS MS: no deformity or atrophy  Skin: warm and dry, no rash Neuro:  Strength and sensation are intact Psych: euthymic mood, full affect   EKG:   The ekg ordered today demonstrates NSR, nonspecific ST changes   Recent Labs: No results found for requested labs within last 8760 hours.   Lipid Panel  No results found for: CHOL, TRIG, HDL, CHOLHDL, VLDL, LDLCALC, LDLDIRECT   Other studies Reviewed: Additional studies/ records that were reviewed today with results demonstrating: monitor results reviewed.   ASSESSMENT AND PLAN:  1.   SVT: Can start Coreg 6.25 BID to help with BP and tachycardia.  BPs have been at home.  If this can be uptitrated, would try to wean clonidine. 2.   HTN:  High readings at home and at MDs office.  F/u with PharmD clinic.   3.   Obesity: We spoke about whole food plant based diet.  Avoid processed foods.   4.  OSA: Check sleep study.  CPAP may help her decrease BP meds at this time.    Current medicines are reviewed at length with the patient today.  The patient concerns regarding her medicines were addressed.  The following changes have been made:  No change  Labs/ tests ordered today include:  No orders of the defined types were placed in this encounter.   Recommend 150 minutes/week of aerobic exercise Low fat, low carb, high fiber diet recommended  Disposition:   FU in 2-3 weeks , HTN clinic   Signed, Larae Grooms, MD  11/28/2019 9:17 AM    [facility] [place], [place], [place]  [address] Phone: [phone]; Fax: [phone]

## 2019-11-28 NOTE — Patient Instructions (Signed)
Medication Instructions:  Your physician has recommended you make the following change in your medication:   START: carvedilol (coreg) 6.25 mg tablet: Take 1 tablet by mouth twice a day  If you need a refill on your cardiac medications before your next appointment, please call your pharmacy.   Lab work: None Ordered  If you have labs (blood work) drawn today and your tests are completely normal, you will receive your results only by: Marland Kitchen MyChart Message (if you have MyChart) OR . A paper copy in the mail If you have any lab test that is abnormal or we need to change your treatment, we will call you to review the results.  Testing/Procedures: Your physician has recommended that you have a sleep study. This test records several body functions during sleep, including: brain activity, eye movement, oxygen and carbon dioxide blood levels, heart rate and rhythm, breathing rate and rhythm, the flow of air through your mouth and nose, snoring, body muscle movements, and chest and belly movement.  Follow-Up: . Your physician recommends that you schedule a follow-up appointment in the Hypertension Clinic in 2-3 weeks  Any Other Special Instructions Will Be Listed Below (If Applicable).

## 2019-11-28 NOTE — Telephone Encounter (Signed)
-----   Message from Cleon Gustin, [place] sent at 11/28/2019  9:39 AM EST ----- Regarding: Sleep Study Per Dr. Irish Lack, patient needs sleep study done for observed apnea. ESS=11. Please pre-cert. Thanks

## 2019-12-05 ENCOUNTER — Telehealth: Payer: Self-pay | Admitting: *Deleted

## 2019-12-05 NOTE — Telephone Encounter (Addendum)
Patient is scheduled for lab study on 12/20/19. Ptis scheduled for COVID screening on 12/18/19 10:15 prior to SS. Patient understands her sleep study will be done at [facility] sleep lab. Patient understands she will receive a sleep packet in a week or so. Patient understands to call if she does not receive the sleep packet in a timely manner. Patient agrees with treatment and thanked me for call.

## 2019-12-05 NOTE — Telephone Encounter (Signed)
-----   Message from Lauralee Evener, [place] sent at 11/28/2019  4:29 PM EST ----- Regarding: RE: Sleep Study Per Merri Brunette with [employer] no PA is required. Ok to schedule. ----- Message ----- From: Cleon Gustin, RN Sent: 11/28/2019   9:39 AM EST To: Freada Bergeron, CMA, Cv Div Sleep Studies Subject: Sleep Study                                    Per Dr. Irish Lack, patient needs sleep study done for observed apnea. ESS=11. Please pre-cert. Thanks

## 2019-12-11 NOTE — Progress Notes (Signed)
Patient ID: Amber Kelly                 DOB: 22-May-1961                      MRN: [id number]     HPI: Amber Kelly is a 59 y.o. female referred by Dr. Irish Lack to HTN clinic. PMH is significant for SVT, HTN, obesity, and T2DM.  Patient last seen by Dr. Irish Lack on 11/28/19, where her BP was elevated at 160/100 mmHg in clinic. She reports palpitations when lying in bed at night, possibly due to sleep apnea.  Provider started patient on carvedilol 6.25 mg twice daily and referred her to HTN clinic. Provider suggested  weaning the patient off clonidine. Sleep apnea appointment scheduled.  Patient arrives to HTN clinic for medication management. She states she has been feeling better since starting carvedilol (palpitations improved) and can sleep well lying flat. She reports taking medications as instructed, but concerns with taking multiple medications for HTN. She continues to exercise regularly. The patient does not check BP regularly at home. She denies chest pain, headache, and dizziness. BP in clinic today was 152/78 with HR 63.  Current HTN meds: amlodipine/benazepril 10/40 mg once daily, losartan/HCTZ 100/25 mg once daily, clonidine 0.1 mg twice daily, carvedilol 6.25 mg twice daily  Previously tried: valsartan/HCTZ 160/25 mg (changed by provider) BP goal: < 130/80  Family History: Father- diabetes; Mother- HTN  Social History: Retired. No drug or tobacco use. Has 1 alcoholic drink once weekly in summer months; rarely drinks during winter.   Diet: Breakfast- oatmeal or panera bagel w/ 1-2 cups of coffee/tea. Weekend breakfast: eggs, grits, waffles/pancackes. Lunch- Sandwich sometimes with chips and/or fruit; Eats a burger, crabmeat or wings 2-3 times a week. Measures food when adhering to recommended diabetes diet. Does not cook with salt but adds a little after- mainly with cream of wheat, spaghetti, and grits  Exercise: Takes walks in neighborhood for 30 min-1 hr 4-5 days a week.  boflex machine to do arm and stomach exercises. Also does sit-ups.   Home BP readings: 12/10/19 4:25 pm: 150/86 pulse 58 12/11/19 at 10:46am 131/39 hr 69  Reports higher readings in 150s most of the time.  Wt Readings from Last 3 Encounters:  11/28/19 172 lb 12.8 oz (78.4 kg)  05/16/13 183 lb (83 kg)  05/11/13 179 lb (81.2 kg)   BP Readings from Last 3 Encounters:  11/28/19 (!) 160/100  05/18/13 115/72  05/11/13 140/72   Pulse Readings from Last 3 Encounters:  11/28/19 78  05/18/13 71  05/11/13 80    Renal function: CrCl cannot be calculated (Patient's most recent lab result is older than the maximum 21 days allowed.).  Past Medical History:  Diagnosis Date  . BV (bacterial vaginosis)   . Diabetes mellitus ([place])   . Dysmenorrhea   . Fibroids   . Hypertension   . Menorrhagia     Current Outpatient Medications on File Prior to Visit  Medication Sig Dispense Refill  . amLODipine-benazepril (LOTREL) 10-40 MG per capsule Take 1 capsule by mouth daily.    Marland Kitchen atorvastatin (LIPITOR) 10 MG tablet Take 10 mg by mouth daily.    Marland Kitchen CALCIUM PO Take 1 tablet by mouth daily.    . carvedilol (COREG) 6.25 MG tablet Take 1 tablet (6.25 mg total) by mouth 2 (two) times daily. 180 tablet 3  . cloNIDine (CATAPRES) 0.1 MG tablet Take 0.1 mg by mouth 2 (  two) times daily.    . cycloSPORINE (RESTASIS) 0.05 % ophthalmic emulsion Place 1 drop into both eyes 2 (two) times daily.    Marland Kitchen JARDIANCE 10 MG TABS tablet Take 10 mg by mouth daily.    Marland Kitchen KRILL OIL OMEGA-3 PO Take 1 capsule by mouth daily. Mega Red    . losartan-hydrochlorothiazide (HYZAAR) 100-25 MG tablet Take 1 tablet by mouth every morning.    . metFORMIN (GLUCOPHAGE) 500 MG tablet Take 1,000 mg by mouth 2 (two) times daily.    . Multiple Vitamin (MULTIVITAMIN WITH MINERALS) TABS Take 1 tablet by mouth daily.     No current facility-administered medications on file prior to visit.    No Known Allergies   Assessment/Plan:  1.  Hypertension - BP remains above goal of < 130/80 mmHg. Switch losartan/HCTZ 100/25 mg to HCTZ 25 mg because of duplicate therapy with losartan (ARB) and benazepril (ACEi). The patient does not require clonidine anymore due to optimal alternative blood pressure medications. The patient will start a slow taper with clonidine 0.1 mg: (1) Starting tomorrow, take 1 tablet of clonidine once daily in the evening for 2 weeks. (2) Then, take 1/2 a tablet of clonidine once daily in the evening for 2 weeks. (3) Then stop. Continue taking amlodipine/benzapril 10/40 mg once daily and carvedilol 6.25 mg twice a day (pulse stable). Advised patient to check blood pressure and pulse at home daily 2 hours after taking morning medications. Will call patient tomorrow with BMET lab results and discuss possibly starting spironolactone 12.5 mg once daily.    Julieta Bellini, PharmD Candidate  Drexel Iha, PharmD PGY2 Ambulatory Care Pharmacy Resident

## 2019-12-12 ENCOUNTER — Other Ambulatory Visit: Payer: Self-pay

## 2019-12-12 ENCOUNTER — Ambulatory Visit (INDEPENDENT_AMBULATORY_CARE_PROVIDER_SITE_OTHER): Payer: BC Managed Care – PPO | Admitting: Pharmacist

## 2019-12-12 DIAGNOSIS — I10 Essential (primary) hypertension: Secondary | ICD-10-CM

## 2019-12-12 MED ORDER — HYDROCHLOROTHIAZIDE 25 MG PO TABS: 25.0000 mg | ORAL_TABLET | Freq: Every day | ORAL | 11 refills | Status: DC

## 2019-12-12 NOTE — Patient Instructions (Addendum)
1. STOP taking losartan/HCTZ and START HCTZ 25 mg once daily in the morning. 2. We are going to SLOWLY take you off clonidine 0.1 mg:  Starting tomorrow, take 1 tablet of clonidine ONCE DAILY in the evening for 2 weeks. Then, take 1/2 a tablet of clonidine ONCE DAILY in the evening for 2 weeks. THEN STOP. 3. Continue taking amlodipine/benzapril 10/40 mg once daily in the evening.  4. Continue taking carvedilol 6.25 mg twice a day. 5. Check your blood pressure and pulse at home daily 2 hours after taking morning medications. 6. I will call you tomorrow with your lab results and discuss possibly starting spironolactone.

## 2019-12-13 ENCOUNTER — Ambulatory Visit: Payer: BC Managed Care – PPO

## 2019-12-13 ENCOUNTER — Telehealth: Payer: Self-pay | Admitting: Pharmacist

## 2019-12-13 LAB — BASIC METABOLIC PANEL
BUN/Creatinine Ratio: 18 (ref 9–23)
BUN: 10 mg/dL (ref 6–24)
CO2: 27 mmol/L (ref 20–29)
Calcium: 10.1 mg/dL (ref 8.7–10.2)
Chloride: 100 mmol/L (ref 96–106)
Creatinine, Ser: 0.56 mg/dL — ABNORMAL LOW (ref 0.57–1.00)
GFR calc Af Amer: 118 mL/min/{1.73_m2} (ref >59)
GFR calc non Af Amer: 102 mL/min/{1.73_m2} (ref >59)
Glucose: 102 mg/dL — ABNORMAL HIGH (ref 65–99)
Potassium: 4.1 mmol/L (ref 3.5–5.2)
Sodium: 142 mmol/L (ref 134–144)

## 2019-12-13 NOTE — Telephone Encounter (Addendum)
Baseline K and scr are stable especially considering patient was on both an ACE and ARB. Will start spironolactone 12.5mg  daily. Called and left VM to discuss with patient

## 2019-12-14 MED ORDER — SPIRONOLACTONE 25 MG PO TABS: 12.5000 mg | ORAL_TABLET | Freq: Every day | ORAL | 3 refills | Status: DC

## 2019-12-14 NOTE — Telephone Encounter (Signed)
Left voicemail again to discuss labs and adding spironolactone. Rx sent to pharmacy

## 2019-12-18 ENCOUNTER — Telehealth: Payer: Self-pay | Admitting: Pharmacist

## 2019-12-18 ENCOUNTER — Other Ambulatory Visit (HOSPITAL_COMMUNITY)
Admission: RE | Admit: 2019-12-18 | Discharge: 2019-12-18 | Disposition: A | Discharge status: Home / Self Care | Payer: BC Managed Care – PPO | Source: Ambulatory Visit | Attending: Cardiology | Admitting: Cardiology

## 2019-12-18 DIAGNOSIS — Z20822 Contact with and (suspected) exposure to covid-19: Secondary | ICD-10-CM | POA: Diagnosis not present

## 2019-12-18 DIAGNOSIS — Z01812 Encounter for preprocedural laboratory examination: Secondary | ICD-10-CM | POA: Insufficient documentation

## 2019-12-18 LAB — SARS CORONAVIRUS 2 (TAT 6-24 HRS): SARS Coronavirus 2: NEGATIVE

## 2019-12-18 NOTE — Telephone Encounter (Signed)
Called patient on 12/18/2019 at 10:23 AM and left HIPAA-compliant VM with instructions to call [facility] [place] clinic back on patient's cell phone. Patient's home phone has been disconnected/no longer in service.  Plan to schedule f/u appt with HTN clinic  Drexel Iha, PharmD PGY2 Ambulatory Care Pharmacy Resident

## 2019-12-20 ENCOUNTER — Other Ambulatory Visit: Payer: Self-pay

## 2019-12-20 ENCOUNTER — Ambulatory Visit (HOSPITAL_BASED_OUTPATIENT_CLINIC_OR_DEPARTMENT_OTHER): Payer: BC Managed Care – PPO | Attending: Interventional Cardiology | Admitting: Cardiology

## 2019-12-20 DIAGNOSIS — G4733 Obstructive sleep apnea (adult) (pediatric): Secondary | ICD-10-CM | POA: Diagnosis present

## 2019-12-20 DIAGNOSIS — G4730 Sleep apnea, unspecified: Secondary | ICD-10-CM

## 2019-12-21 ENCOUNTER — Telehealth: Payer: Self-pay | Admitting: *Deleted

## 2019-12-21 ENCOUNTER — Other Ambulatory Visit (HOSPITAL_BASED_OUTPATIENT_CLINIC_OR_DEPARTMENT_OTHER): Payer: Self-pay

## 2019-12-21 DIAGNOSIS — G4730 Sleep apnea, unspecified: Secondary | ICD-10-CM

## 2019-12-21 NOTE — Procedures (Addendum)
   Patient Name: Amber Kelly, [place] Study Date:11/09/2017 12/20/2019 Gender: Female D.O.B: [birth year] Age (years): 58 Referring Provider: Larae Grooms Height (inches): 53 Interpreting Physician: Fransico Him MD, ABSM Weight (lbs): 183 RPSGT: Carolin Coy BMI: 33 MRN: [id number] Neck Size: 16.50  CLINICAL INFORMATION Sleep Study Type: NPSG  Indication for sleep study: Diabetes, Hypertension, Obesity, Snoring  Epworth Sleepiness Score: 5  SLEEP STUDY TECHNIQUE As per the AASM Manual for the Scoring of Sleep and Associated Events v2.3 (April 2016) with a hypopnea requiring 4% desaturations.  The channels recorded and monitored were frontal, central and occipital EEG, electrooculogram (EOG), submentalis EMG (chin), nasal and oral airflow, thoracic and abdominal wall motion, anterior tibialis EMG, snore microphone, electrocardiogram, and pulse oximetry.  MEDICATIONS Medications self-administered by patient taken the night of the study : CARVEDILOL, CLONIDINE, AMLODIPINE-BENAZEPRIL  SLEEP ARCHITECTURE The study was initiated at 10:21:11 PM and ended at 4:46:43 AM.  Sleep onset time was 20.2 minutes and the sleep efficiency was 67.3%. The total sleep time was 259.5 minutes.  Stage REM latency was 131.5 minutes.  The patient spent 11.6% of the night in stage N1 sleep, 73.8% in stage N2 sleep, 0.0% in stage N3 and 14.6% in REM.  Alpha intrusion was absent.  Supine sleep was 0.77%.  RESPIRATORY PARAMETERS The overall apnea/hypopnea index (AHI) was 9.7 per hour. There were 1 total apneas, including 1 obstructive, 0 central and 0 mixed apneas. There were 41 hypopneas and 15 RERAs.  The AHI during Stage REM sleep was 58.4 per hour.  AHI while supine was 30.0 per hour.  The mean oxygen saturation was 94.4%. The minimum SpO2 during sleep was 84.0%.  moderate snoring was noted during this study.  CARDIAC DATA The 2 lead EKG demonstrated sinus rhythm. The mean heart rate was  58.2 beats per minute. Other EKG findings include: None.  LEG MOVEMENT DATA The total PLMS were 0 with a resulting PLMS index of 0.0. Associated arousal with leg movement index was 0.0 .  IMPRESSIONS - Mild obstructive sleep apnea occurred during this study (AHI = 9.7/h). - No significant central sleep apnea occurred during this study (CAI = 0.0/h). - Mild oxygen desaturation was noted during this study (Min O2 = 84.0%). - The patient snored with moderate snoring volume. - No cardiac abnormalities were noted during this study. - Clinically significant periodic limb movements did not occur during sleep. No significant associated arousals.  DIAGNOSIS - Obstructive Sleep Apnea (327.23 [G47.33 ICD-10])  RECOMMENDATIONS - Therapeutic CPAP titration to determine optimal pressure required to alleviate sleep disordered breathing. - Positional therapy avoiding supine position during sleep. - Avoid alcohol, sedatives and other CNS depressants that may worsen sleep apnea and disrupt normal sleep architecture. - Sleep hygiene should be reviewed to assess factors that may improve sleep quality. - Weight management and regular exercise should be initiated or continued if appropriate.  [Electronically signed] 12/21/2019 08:53 AM  Fransico Him MD, ABSM Diplomate, American Board of Sleep Medicine

## 2019-12-21 NOTE — Telephone Encounter (Signed)
Informed patient of sleep study results and patient understanding was verbalized. Patient understands her sleep study showed they have sleep apnea and recommend CPAP titration. Please set up titration in the sleep lab.   Pt is aware of her results. Titration sent to sleep pool.  

## 2019-12-21 NOTE — Telephone Encounter (Signed)
-----   Message from Sueanne Margarita, MD sent at 12/21/2019  9:52 AM EST ----- Please let patient know that they have sleep apnea and recommend CPAP titration. Please set up titration in the sleep lab.

## 2019-12-25 ENCOUNTER — Telehealth: Payer: Self-pay | Admitting: *Deleted

## 2019-12-25 ENCOUNTER — Telehealth: Payer: Self-pay | Admitting: Pharmacist

## 2019-12-25 NOTE — Telephone Encounter (Signed)
Per Collins [place] with [employer] @ 9:40am on 12/25/19. No PA is required for in lab sleep study. Gae Bon notified ok to schedule CPAP titration.

## 2019-12-25 NOTE — Telephone Encounter (Signed)
-----   Message from Freada Bergeron, [place] sent at 12/21/2019 10:54 AM EST ----- Regarding: precert recommend CPAP titration

## 2019-12-25 NOTE — Telephone Encounter (Signed)
Called patient on 12/25/2019 at 2:07 PM and left HIPAA-compliant VM with instructions to call [place] clinic back on patient's cell phone. Patient's home phone has been disconnected/no longer in service.  Plan to schedule f/u appt with HTN clinic  Drexel Iha, PharmD PGY2 Ambulatory Care Pharmacy Resident

## 2019-12-26 ENCOUNTER — Telehealth: Payer: Self-pay | Admitting: Pharmacist

## 2019-12-26 ENCOUNTER — Telehealth: Payer: Self-pay | Admitting: *Deleted

## 2019-12-26 DIAGNOSIS — G4730 Sleep apnea, unspecified: Secondary | ICD-10-CM

## 2019-12-26 NOTE — Telephone Encounter (Signed)
-----   Message from [place] sent at 12/25/2019  9:41 AM EST ----- Regarding: RE: precert Per Collins Scotland @ [employer] no PA is required. Ok to schedule sleep study. ----- Message ----- From: Freada Bergeron, CMA Sent: 12/21/2019  10:54 AM EST To: [facility] Sleep Studies Subject: precert                                        recommend CPAP titration

## 2019-12-26 NOTE — Telephone Encounter (Signed)
Returned patient's call on  12/26/2019 at 2:06 PM and left HIPAA-compliant VM with instructions to call [facility] back on patient's cell phone. Patient's home phone has been disconnected/no longer in service.  Plan to schedule f/u appt with HTN clinic  Drexel Iha, PharmD PGY2 Ambulatory Care Pharmacy Resident

## 2019-12-26 NOTE — Telephone Encounter (Signed)
Patient is scheduled for CPAP Titration on 01/11/20. She is scheduled for COVID screening on 01/09/20 prior to titration.  Patient understands his titration study will be done at [facility] sleep lab. Patient understands he will receive a letter in a week or so detailing appointment, date, time, and location. Patient understands to call if he does not receive the letter  in a timely manner. Patient agrees with treatment and thanked me for call.

## 2020-01-01 NOTE — Telephone Encounter (Signed)
Patient returned call today to schedule HTN appointment. She tested positive for COVID this AM. Will schedule her a few weeks out. Patient scheduled for 3/2 @ 9:300

## 2020-01-08 ENCOUNTER — Other Ambulatory Visit (HOSPITAL_COMMUNITY): Payer: BC Managed Care – PPO

## 2020-01-09 ENCOUNTER — Other Ambulatory Visit (HOSPITAL_COMMUNITY): Payer: BC Managed Care – PPO

## 2020-01-10 ENCOUNTER — Encounter (HOSPITAL_BASED_OUTPATIENT_CLINIC_OR_DEPARTMENT_OTHER): Payer: BC Managed Care – PPO | Admitting: Cardiology

## 2020-01-11 ENCOUNTER — Encounter (HOSPITAL_BASED_OUTPATIENT_CLINIC_OR_DEPARTMENT_OTHER): Payer: BC Managed Care – PPO | Admitting: Cardiology

## 2020-01-18 NOTE — Progress Notes (Signed)
Patient ID: Amber Kelly                 DOB: 03/03/1961                      MRN: [id number]     HPI: Amber Kelly is a 59 y.o. female referred by Dr. Irish Lack to HTN clinic. PMH is significant for SVT, HTN, obesity, and T2DM.  At prior appt with HTN clinic (12/12/2019), patient's blood pressure was 152/78 mmHg with pulse of 63 bpm. Patient's losartan/HCTZ 100/25 mg was switched to HCTZ 25 mg because of duplicate therapy with losartan (ARB) and benazepril (ACEi). The patient does not require clonidine anymore due to optimal alternative blood pressure medications. The patient was instructed to taper off of clonidine 0.1 mg. Spironolactone 12.5 mg was initiated.    Patient presents for follow up appt with HTN clinic. She recently recovered from COVID-19 - had cold/flu symptoms. She took Mucinex and Tylenol to manage symptoms. Patient confirms she did not use pseudoephedrine or NSAIDs to manage symptoms. She remains tolerating all BP medications. Reports lightheadedness/dizziness with exertion since COVID-19. Denies SOB/chest pain/headaches. She has not monitored BP readings since last appt. She typically uses a wrist BP cuff, while her husband uses a bicep BP cuff. She reports taking her medications today this morning at 7:30 AM. She denies caffeine/exercise/nervousness/salty breakfast today (01/23/20). Patient states she thinks she has been diagnosed with white coat hypertension previously.   Current HTN meds: amlodipine/benazepril 10/40 mg once daily, HCTZ 25 mg daily, spironolactone 12.5 mg daily carvedilol 6.25 mg twice daily   Previously tried: valsartan/HCTZ 160/25 mg (changed by provider)  BP goal: < 130/80  Family History: Father- diabetes; Mother- HTN  Social History: Retired. No drug or tobacco use. Has 1 alcoholic drink once weekly in summer months; rarely drinks during winter.   Diet: Breakfast- oatmeal or panera bagel w/ 1-2 cups of coffee/tea. Weekend breakfast: eggs, grits,  waffles/pancackes. Lunch- Sandwich sometimes with chips and/or fruit; Eats a burger, crabmeat or wings 2-3 times a week. Measures food when adhering to recommended diabetes diet. Does not cook with salt but adds a little after- mainly with cream of wheat, spaghetti, and grits  Exercise: Takes walks in neighborhood for 30 min-1 hr 4-5 days a week. boflex machine to do arm and stomach exercises. Also does sit-ups.   Home BP readings: Has not monitored. Typically uses wrist monitor.  Wt Readings from Last 3 Encounters:  12/20/19 183 lb (83 kg)  11/28/19 172 lb 12.8 oz (78.4 kg)  05/16/13 183 lb (83 kg)   BP Readings from Last 3 Encounters:  11/28/19 (!) 160/100  05/18/13 115/72  05/11/13 140/72   Pulse Readings from Last 3 Encounters:  11/28/19 78  05/18/13 71  05/11/13 80    Renal function: CrCl cannot be calculated (Patient's most recent lab result is older than the maximum 21 days allowed.).  Past Medical History:  Diagnosis Date  . BV (bacterial vaginosis)   . Diabetes mellitus ([place])   . Dysmenorrhea   . Fibroids   . Hypertension   . Menorrhagia     Current Outpatient Medications on File Prior to Visit  Medication Sig Dispense Refill  . amLODipine-benazepril (LOTREL) 10-40 MG per capsule Take 1 capsule by mouth daily.    Marland Kitchen atorvastatin (LIPITOR) 10 MG tablet Take 10 mg by mouth daily.    Marland Kitchen CALCIUM PO Take 1 tablet by mouth daily.    Marland Kitchen  carvedilol (COREG) 6.25 MG tablet Take 1 tablet (6.25 mg total) by mouth 2 (two) times daily. 180 tablet 3  . cloNIDine (CATAPRES) 0.1 MG tablet Take 0.1 mg by mouth daily.    . cycloSPORINE (RESTASIS) 0.05 % ophthalmic emulsion Place 1 drop into both eyes 2 (two) times daily.    . hydrochlorothiazide (HYDRODIURIL) 25 MG tablet Take 1 tablet (25 mg total) by mouth daily. 30 tablet 11  . JARDIANCE 10 MG TABS tablet Take 10 mg by mouth daily.    Marland Kitchen KRILL OIL OMEGA-3 PO Take 1 capsule by mouth daily. Mega Red    . metFORMIN (GLUCOPHAGE) 500 MG  tablet Take 1,000 mg by mouth 2 (two) times daily.    . Multiple Vitamin (MULTIVITAMIN WITH MINERALS) TABS Take 1 tablet by mouth daily.    Marland Kitchen spironolactone (ALDACTONE) 25 MG tablet Take 0.5 tablets (12.5 mg total) by mouth daily. 45 tablet 3   No current facility-administered medications on file prior to visit.    No Known Allergies   Assessment/Plan:  1. Hypertension - BP goal is <130/80 mmHg; therefore, patient is not at goal. It is hard to clearly determine if patient is experiencing white coat hypertension or if her blood pressure has increased post-COVID-19. Although patient has been experiencing lightheadedness with exertion it is likely this can be attributable to COVID-19 since these s/sx started after her COVID-19 diagnosis. Will continue to monitor. Advised patient to monitor and record BP readings daily two hours after taking her BP meds using her husband's bicep BP cuff. Will contact patient in 1 week to re-assess BP readings. Continue amlodipine/benazepril 10/40 mg once daily, HCTZ 25 mg daily, spironolactone 12.5 mg daily, and carvedilol 6.25 mg twice daily. Obtaining follow up BMET considering spironolactone was initiated at prior appt. If BMET is stable and BP remains elevated will increase spironolactone from 12.5 mg daily to 25 mg daily. Patient verbalized understanding.   Thank you for involving pharmacy to assist in providing this patient's care.   Drexel Iha, PharmD PGY2 Ambulatory Care Pharmacy Resident

## 2020-01-23 ENCOUNTER — Other Ambulatory Visit: Payer: Self-pay

## 2020-01-23 ENCOUNTER — Ambulatory Visit (INDEPENDENT_AMBULATORY_CARE_PROVIDER_SITE_OTHER): Payer: BC Managed Care – PPO | Admitting: Pharmacist

## 2020-01-23 VITALS — BP 146/100 | HR 76

## 2020-01-23 DIAGNOSIS — I10 Essential (primary) hypertension: Secondary | ICD-10-CM | POA: Diagnosis not present

## 2020-01-23 LAB — BASIC METABOLIC PANEL
BUN/Creatinine Ratio: 12 (ref 9–23)
BUN: 10 mg/dL (ref 6–24)
CO2: 28 mmol/L (ref 20–29)
Calcium: 10.1 mg/dL (ref 8.7–10.2)
Chloride: 96 mmol/L (ref 96–106)
Creatinine, Ser: 0.81 mg/dL (ref 0.57–1.00)
GFR calc Af Amer: 92 mL/min/{1.73_m2} (ref >59)
GFR calc non Af Amer: 80 mL/min/{1.73_m2} (ref >59)
Glucose: 131 mg/dL — ABNORMAL HIGH (ref 65–99)
Potassium: 4.4 mmol/L (ref 3.5–5.2)
Sodium: 139 mmol/L (ref 134–144)

## 2020-01-23 NOTE — Patient Instructions (Addendum)
It was a pleasure seeing you in clinic today [name]!  Today the plan is... 1. Monitor blood pressure using husband's blood pressure cuff. Monitor BP two hours after taking your blood pressure medications. Please write down the blood pressure reading, pulse reading, date, and time.  2. I Amber Kelly) will call you in one week to re-assess BP to determine if you need a BP medication adjustment  Cold and Flu Medications - Wet cough (medicine to help you cough up phlegm) - use guaifenesin (Mucinex) - Dry cough (medicine to help you cough less) - use dextromethrophan (Robitussin) - Congestion (clear stuffy nose) - try to ask PCP for fluticasone (Flonase) nasal spray - Allergy symptoms (runny nose and itchy eyes - take an antihistamine - loratadine (Claritin) or fexofenadine (Allegra)  Please call the PharmD clinic at [phone] if you have any questions that you would like to speak with a pharmacist about Amber Kelly, Amber Kelly, Amber Kelly).

## 2020-01-30 ENCOUNTER — Telehealth: Payer: Self-pay | Admitting: Pharmacist

## 2020-01-30 NOTE — Telephone Encounter (Signed)
Called patient on 01/30/2020 at 1:11 PM and left HIPAA-compliant VM with instructions to call [place] clinic back   Plan to re-assess BP management  Thank you for involving pharmacy to assist in providing this patient's care.   Drexel Iha, PharmD PGY2 Ambulatory Care Pharmacy Resident

## 2020-01-31 NOTE — Telephone Encounter (Signed)
Called patient on 01/31/2020 at 5:45 PM and left HIPAA-compliant VM with instructions to call [place] clinic back   Patient contacted [place] clinic earlier and asked to be called after 5PM to discuss BP management. Patient still did not answer. LVM. Will contact patient again on Friday (02/02/2020) to discuss BP management.  Thank you for involving pharmacy to assist in providing this patient's care.   Drexel Iha, PharmD PGY2 Ambulatory Care Pharmacy Resident

## 2020-02-02 ENCOUNTER — Telehealth: Payer: Self-pay | Admitting: Pharmacist

## 2020-02-02 NOTE — Telephone Encounter (Signed)
Called patient on 02/02/2020 at 10:40 AM and left HIPAA-compliant VM with instructions to call [place] clinic back   Unsuccessful attempt # 2  Patient's BP was elevated at prior appt (01/23/2020). Patient was hesitant to adjust BP medications considering she think she may have white coat hypertension. Amlodipine/benazepril 10/40 mg once daily, HCTZ 25 mg daily, spironolactone 12.5 mg daily, and carvedilol 6.25 mg twice daily were continued.   BMET on 01/23/20 was stable. If BP uncontrolled at home then plan to discuss increasing spironolactone from 12.5 mg daily to 25 mg daily.   Will follow up again on 02/06/20  Thank you for involving pharmacy to assist in providing this patient's care.   Drexel Iha, PharmD PGY2 Ambulatory Care Pharmacy Resident

## 2020-02-06 ENCOUNTER — Telehealth: Payer: Self-pay | Admitting: Pharmacist

## 2020-02-06 ENCOUNTER — Encounter: Payer: Self-pay | Admitting: Pharmacist

## 2020-02-06 NOTE — Telephone Encounter (Signed)
Called patient on 02/06/2020 at 11:39 AM and left HIPAA-compliant VM with instructions to call [place] clinic back.  Unsuccessful attempt # 3  Patient's BP was elevated at prior appt (01/23/2020). Patient was hesitant to adjust BP medications considering she think she may have white coat hypertension. Amlodipine/benazepril 10/40 mg once daily,HCTZ 25 mg daily, spironolactone 12.5 mg daily, andcarvedilol 6.25 mg twice daily were continued.   BMET on 01/23/20 was stable. If BP uncontrolled at home then plan to discuss increasing spironolactone from 12.5 mg daily to 25 mg daily.   Have also sent a message on MyChart to patient. Will await patient's response at this time.  Thank you for involving pharmacy to assist in providing this patient's care.   Drexel Iha, PharmD PGY2 Ambulatory Care Pharmacy Resident

## 2020-02-19 ENCOUNTER — Telehealth: Payer: Self-pay | Admitting: Pharmacist

## 2020-02-19 NOTE — Telephone Encounter (Signed)
Called patient on 02/19/2020 at 6:10 PM   Patient reports she has been very busy and has not been able to monitor her BP. She states she is at work 2 hours after she takes her BP meds and is unable to monitor her BP. Last two blood pressure readings she has taken are listed below:   01/30/20 8:15 AM 160/109 pulse 70 02/06/20 9:48 PM 145/74 pulse 73  She attributes elevated readings to "running around in the morning". Patient inquires about best time to monitor BP if she is unable to monitor he readings  2 hrs after administering BP meds. Advised pt to monitor her BP in the evenings separating monitoring blood pressure ~30 minutes from any activities that will increase BP (eating salty meal, drinking caffeine, exercising). Patient verbalized understanding. Stressed the importance of monitor BP daily or at least every other day. Patient verbalized understanding and confirmed she will monitor BP more. Continue Amlodipine/benazepril 10/40 mg once daily, HCTZ 25 mg daily, spironolactone 12.5 mg daily, and carvedilol 6.25 mg twice daily. Scheduled appt for 03/05/20 at 9:00 AM to re-assess BP management. Will consider increasing spironolactone from 12.5 mg daily to 25 mg daily at that time (last, recent BMET was stable)  Thank you for involving pharmacy to assist in providing this patient's care.   Drexel Iha, PharmD PGY2 Ambulatory Care Pharmacy Resident

## 2020-03-04 NOTE — Progress Notes (Signed)
Patient ID: DEMARCUS GUZMAN                 DOB: [birth year]                      MRN: [id number]     HPI: Amber Kelly is a 59 y.o. female referred by Dr. Irish Lack to HTN clinic. PMH is significant for SVT, HTN, obesity, and T2DM.  At prior appt with HTN clinic (01/23/2020), patient's blood pressure was 146/100 mmHg with pulse of 76 bpm. Of note, patient uses a wrist BP cuff and thinks she may have been diagnosed with white coat hypertension previously. Patient had recently recovered from COVID-19, however, was not consistently monitoring BP readings at the time to evaluate if patient's BP had increased after having COVID-19. Patient was tolerating recent initiation of spironolactone 12.5 mg daily. Patient was hesitant to increase spironolactone dose after discussion of stable BMET results considering she thinks she has white coat hypertension and that was why her BP was elevated at office visit. Amlodipine/benazepril 10/40 mg once daily, HCTZ 25 mg daily, spironolactone 12.5 mg daily carvedilol 6.25 mg twice daily were continued. Advised patient to monitor BP readings to determine if she was experiencing white coat hypertension. Attempted to contact patient multiple times and was unsuccessful. Eventually was able to contact patient on 02/19/20, she confirmed she had recorded two blood pressure readings within the past month which are listed as follows: 1) 01/30/20 8:15 AM 160/109 mmHg pulse 70 2) 02/06/20 9:48 PM 145/74 mmHg pulse 73. She attributed elevated readings to "running around in the morning" and remained hesitant regarding change to HTN medication regimen. Discussed thoroughly optimal time to monitor BP readings. Patient verbalized understanding.  Patient presents for follow up appt with HTN clinic. She has switched from using wrist cuff to bicep cuff BP monitor (uses her husbands). She has transitioned from monitoring her BP in the morning to the evening (~8PM), which she states works better for her  schedule. She brought her prescription bottles today to verify all medications she is taking. Denies SOB/chest pain/headaches/lightheadedness/dizziness.  Current HTN meds: amlodipine/benazepril 10/40 mg once daily (9PM), HCTZ 25 mg daily (8AM), spironolactone 12.5 mg daily (8AM), carvedilol 6.25 mg twice daily (8AM, 9PM)   Previously tried: valsartan/HCTZ 160/25 mg (changed by provider)  BP goal: < 130/80 mmHg  Family History: Father- diabetes; Mother- HTN  Social History: Retired. No drug or tobacco use. Has 1 alcoholic drink once weekly in summer months; rarely drinks during winter.   Diet: Breakfast- oatmeal or panera bagel w/ 1-2 cups of coffee/tea. Weekend breakfast: eggs, grits, waffles/pancackes. Lunch- Sandwich sometimes with chips and/or fruit; Eats a burger, crabmeat or wings 2-3 times a week. Measures food when adhering to recommended diabetes diet. Does not cook with salt but adds a little after- mainly with cream of wheat, spaghetti, and grits  Exercise: Takes walks in neighborhood for 30 min-1 hr 4-5 days a week. boflex machine to do arm and stomach exercises. Also does sit-ups.   Home BP readings (uses wrist BP cuff): 3/29 138/83 69 3/31 145/83 74 4/5 148/86 72 4/6 140/80 74 4/7 146/84 69 4/8 146/96 72 4/8 133/85 70 4/11 145/84 65    Wt Readings from Last 3 Encounters:  12/20/19 183 lb (83 kg)  11/28/19 172 lb 12.8 oz (78.4 kg)  05/16/13 183 lb (83 kg)   BP Readings from Last 3 Encounters:  01/23/20 (!) 146/100  11/28/19 (!) 160/100  05/18/13  115/72   Pulse Readings from Last 3 Encounters:  01/23/20 76  11/28/19 78  05/18/13 71    Renal function: CrCl cannot be calculated (Patient's most recent lab result is older than the maximum 21 days allowed.).  Past Medical History:  Diagnosis Date  . BV (bacterial vaginosis)   . Diabetes mellitus ([place])   . Dysmenorrhea   . Fibroids   . Hypertension   . Menorrhagia     Current Outpatient Medications on File  Prior to Visit  Medication Sig Dispense Refill  . amLODipine-benazepril (LOTREL) 10-40 MG per capsule Take 1 capsule by mouth daily.    Marland Kitchen atorvastatin (LIPITOR) 10 MG tablet Take 10 mg by mouth daily.    Marland Kitchen CALCIUM PO Take 1 tablet by mouth daily.    . carvedilol (COREG) 6.25 MG tablet Take 1 tablet (6.25 mg total) by mouth 2 (two) times daily. 180 tablet 3  . cloNIDine (CATAPRES) 0.1 MG tablet Take 0.1 mg by mouth daily.    . cycloSPORINE (RESTASIS) 0.05 % ophthalmic emulsion Place 1 drop into both eyes 2 (two) times daily.    . hydrochlorothiazide (HYDRODIURIL) 25 MG tablet Take 1 tablet (25 mg total) by mouth daily. 30 tablet 11  . JARDIANCE 10 MG TABS tablet Take 10 mg by mouth daily.    Marland Kitchen KRILL OIL OMEGA-3 PO Take 1 capsule by mouth daily. Mega Red    . metFORMIN (GLUCOPHAGE) 500 MG tablet Take 1,000 mg by mouth 2 (two) times daily.    . Multiple Vitamin (MULTIVITAMIN WITH MINERALS) TABS Take 1 tablet by mouth daily.    Marland Kitchen spironolactone (ALDACTONE) 25 MG tablet Take 0.5 tablets (12.5 mg total) by mouth daily. 45 tablet 3   No current facility-administered medications on file prior to visit.    No Known Allergies   Assessment/Plan:  1. Hypertension - BP goal is <130/80 mmHg; therefore, BP remains slightly elevated, which likely is due to post COVID-19 effects. Patient is not experiencing hypotension. Plan to increase spironolactone from 12.5 mg to 25 mg daily. Continue amlodipine/benazepril 10/40 mg once daily, HCTZ 25 mg daily, and carvedilol 6.25 mg twice daily. Scheduled follow up office visit on 04/08/20 at 2:30 PM and BMET lab draw on 04/08/20 at 3:00 PM. (patient is going on vacation for her daughter's graduation so this is the most convenient follow up time). Continue to monitor blood pressure readings. Advised patient to bring blood pressure cuff to follow up appt to calibrate. Patient verbalized understanding.  Future Considerations -Titrate spironolactone to max tolerated dose  (BMET from 01/23/20 stable) -Increase carvedilol from 6.25 mg twice daily to 12.5 mg twice daily -Initiate hydralazine 12.5 mg three times daily  2. Refills - Patient requests refills for amlodipine-benazepril 10/40 mg, atorvastatin 10 mg daily, spironolactone 25 mg daily, and Jardiance 10 mg daily. Sent in 1 year of refills for all meds except Jardiance (sent in 30-day supply without RF; Jardiance is managed by PCP and she thinks she will run out prior to PCP appt scheduled at the end of April) to patient's preferred pharmacy. Patient expresses appreciation.   Thank you for involving pharmacy to assist in providing this patient's care.   Drexel Iha, PharmD PGY2 Ambulatory Care Pharmacy Resident

## 2020-03-05 ENCOUNTER — Ambulatory Visit (INDEPENDENT_AMBULATORY_CARE_PROVIDER_SITE_OTHER): Payer: BC Managed Care – PPO | Admitting: Pharmacist

## 2020-03-05 ENCOUNTER — Other Ambulatory Visit: Payer: Self-pay

## 2020-03-05 VITALS — BP 138/84 | HR 70

## 2020-03-05 DIAGNOSIS — I10 Essential (primary) hypertension: Secondary | ICD-10-CM | POA: Diagnosis not present

## 2020-03-05 DIAGNOSIS — E119 Type 2 diabetes mellitus without complications: Secondary | ICD-10-CM | POA: Diagnosis not present

## 2020-03-05 MED ORDER — ATORVASTATIN CALCIUM 10 MG PO TABS: 10.0000 mg | ORAL_TABLET | Freq: Every day | ORAL | 11 refills | Status: DC

## 2020-03-05 MED ORDER — SPIRONOLACTONE 25 MG PO TABS
25.0000 mg | ORAL_TABLET | Freq: Every day | ORAL | 11 refills | Status: DC
Start: 2020-03-05 — End: 2020-04-23

## 2020-03-05 MED ORDER — EMPAGLIFLOZIN 10 MG PO TABS: 10.0000 mg | ORAL_TABLET | Freq: Every day | ORAL | 0 refills | Status: DC

## 2020-03-05 MED ORDER — AMLODIPINE BESY-BENAZEPRIL HCL 10-40 MG PO CAPS: 1.0000 | ORAL_CAPSULE | Freq: Every day | ORAL | 11 refills | Status: DC

## 2020-03-05 NOTE — Patient Instructions (Addendum)
It was a pleasure seeing you in clinic today Mrs. Amber Kelly!  Today the plan is... 1. INCREASE spironolactone 12.5 mg (1/2 tablet) to spironolactone 25 mg daily (1 tablet) 2. Continue amlodipine/benazepril 10/40 mg once daily, hydrochlorothiazide 25 mg daily, carvedilol 6.25 mg twice daily 3. Continue to check blood pressure reading at least 4 times per week (ideally once per day).  4. Bring blood pressure cuff to next appointment to calibrate cuff    Please call the PharmD clinic at [phone] if you have any questions that you would like to speak with a pharmacist about [name], [place], [place]).

## 2020-04-08 ENCOUNTER — Ambulatory Visit (INDEPENDENT_AMBULATORY_CARE_PROVIDER_SITE_OTHER): Payer: BC Managed Care – PPO | Admitting: Pharmacist

## 2020-04-08 ENCOUNTER — Other Ambulatory Visit: Payer: Self-pay

## 2020-04-08 ENCOUNTER — Other Ambulatory Visit: Payer: Self-pay | Admitting: Interventional Cardiology

## 2020-04-08 ENCOUNTER — Other Ambulatory Visit: Payer: BC Managed Care – PPO

## 2020-04-08 VITALS — BP 140/78 | HR 69

## 2020-04-08 DIAGNOSIS — I10 Essential (primary) hypertension: Secondary | ICD-10-CM

## 2020-04-08 NOTE — Patient Instructions (Signed)
Before checking your blood pressure make sure: You are seated and quite for 5 min before checking Feet are flat on the floor Siting in chair with your back supported straight up and down Arm resting on table or arm of chair at heart level Bladder is empty You have NOT had caffeine or tobacco within the last 30 min  Check your blood pressure 2-3 times about 1-2 min apart. Usually the first reading will be the highest. Record these readings.  Only check your blood pressure once a day, unless otherwise directed Record your blood pressure readings and bring them to all your appointments. If your meter stores your readings in its memory, then you may bring your blood pressure meter with you to your appointments.  You can find a list of validated (accurate) blood pressure cuffs at PopPath.it  Lifestyle changes can make a world of difference and are even more important than medications: Try to keep your sodium intake to 2300 mg of sodium per day Get 6-8 uninterrupted hours of sleep per night Aim for a goal of 150 min of moderate aerobic exercise (ie brisk walking, bike riding) per week

## 2020-04-08 NOTE — Progress Notes (Signed)
Patient ID: Amber Kelly                 DOB: 08/21/1961                      MRN: [id number]     HPI: Amber Kelly is a 59 y.o. female referred by Dr. Irish Lack to HTN clinic. PMH is significant for SVT, HTN, obesity, and T2DM.  At prior appt with HTN clinic (4/13), patient's blood pressure was 138/84 mmHg with pulse of 70 bpm. She had switched to using a upper arm cuff and taking her BP in the PM. Spironolactone was increased to 25mg  daily.   Patient presents today for follow up. She denies dizziness, lightheadedness, headache, blurred vision, SOB or swelling. She has checked her blood pressure a few times since last visit. Home blood pressure cuff was compared to manual clinic cuff and found to be accurate. 140/78 vs 141/78. Home blood pressure readings are close to goal. Patient states she has been checking in her recliner. She also had the cuff on incorrectly when she placed it initially in clinic.  She has not been exercising much over the last several weeks due to allergies and travel. Received her second COVID vaccine yesterday. Patient states she has white coat HTN.  Current HTN meds: amlodipine/benazepril 10/40 mg once daily (9PM), HCTZ 25 mg daily (8AM), spironolactone 25 mg daily (8AM), carvedilol 6.25 mg twice daily (8AM, 9PM)   Previously tried: valsartan/HCTZ 160/25 mg (changed by provider)  BP goal: < 130/80 mmHg  Family History: Father- diabetes; Mother- HTN  Social History: Retired. No drug or tobacco use. Has 1 alcoholic drink once weekly in summer months; rarely drinks during winter.   Diet: Breakfast- oatmeal or panera bagel w/ 1-2 cups of coffee/tea. Weekend breakfast: eggs, grits, waffles/pancackes. Lunch- Sandwich sometimes with chips and/or fruit; Eats a burger, crabmeat or wings 2-3 times a week. Measures food when adhering to recommended diabetes diet. Does not cook with salt but adds a little after- mainly with cream of wheat, spaghetti, and grits  Exercise:  Takes walks in neighborhood for 30 min-1 hr 4-5 days a week. boflex machine to do arm and stomach exercises. Also does sit-ups.   Home BP readings: 134/82,  5/15 8 AM 154/89 HR 69  After resting a few more min 134/85 125/82 HR 72 136/82 HR 69 135/81 HR 71  Wt Readings from Last 3 Encounters:  12/20/19 183 lb (83 kg)  11/28/19 172 lb 12.8 oz (78.4 kg)  05/16/13 183 lb (83 kg)   BP Readings from Last 3 Encounters:  03/05/20 138/84  01/23/20 (!) 146/100  11/28/19 (!) 160/100   Pulse Readings from Last 3 Encounters:  03/05/20 70  01/23/20 76  11/28/19 78    Renal function: CrCl cannot be calculated (Patient's most recent lab result is older than the maximum 21 days allowed.).  Past Medical History:  Diagnosis Date  . BV (bacterial vaginosis)   . Diabetes mellitus ([place])   . Dysmenorrhea   . Fibroids   . Hypertension   . Menorrhagia     Current Outpatient Medications on File Prior to Visit  Medication Sig Dispense Refill  . amLODipine-benazepril (LOTREL) 10-40 MG capsule Take 1 capsule by mouth daily. 30 capsule 11  . atorvastatin (LIPITOR) 10 MG tablet Take 1 tablet (10 mg total) by mouth daily. 30 tablet 11  . CALCIUM PO Take 1 tablet by mouth daily.    . carvedilol (  COREG) 6.25 MG tablet Take 1 tablet (6.25 mg total) by mouth 2 (two) times daily. 180 tablet 3  . cycloSPORINE (RESTASIS) 0.05 % ophthalmic emulsion Place 1 drop into both eyes 2 (two) times daily.    . empagliflozin (JARDIANCE) 10 MG TABS tablet Take 10 mg by mouth daily before breakfast. 30 tablet 0  . fluticasone (FLONASE ALLERGY RELIEF) 50 MCG/ACT nasal spray Place 1 spray into both nostrils daily.    . hydrochlorothiazide (HYDRODIURIL) 25 MG tablet Take 1 tablet (25 mg total) by mouth daily. 30 tablet 11  . KRILL OIL OMEGA-3 PO Take 1 capsule by mouth daily. Mega Red    . metFORMIN (GLUCOPHAGE) 500 MG tablet Take 1,000 mg by mouth 2 (two) times daily.    . Multiple Vitamin (MULTIVITAMIN WITH MINERALS)  TABS Take 1 tablet by mouth daily.    Marland Kitchen spironolactone (ALDACTONE) 25 MG tablet Take 1 tablet (25 mg total) by mouth daily. 30 tablet 11   No current facility-administered medications on file prior to visit.    No Known Allergies   Assessment/Plan:  1. Hypertension - BP goal is <130/80 mmHg; therefore, BP remains slightly elevated. Since patient home BP checking technique was not perfect and she insists blood pressure is always higher in clinic, I will allow her 2 more weeks to check blood pressure at home. Will do a virtual visit in 2 weeks. If BMP from today is stable and BP is still high, will increase spironolactone to 50mg  daily. Patient educated on proper technique and provided with written instructions. She was encouraged to increase exercise back up to 5 days a week. She says she will go to [facility] to walk and see if their cleaning standard meet her standards.  Future Considerations -Titrate spironolactone to max tolerated dose (BMET from 01/23/20 stable) -Increase carvedilol from 6.25 mg twice daily to 12.5 mg twice daily -Initiate hydralazine 12.5 mg three times daily.   Thank you for involving pharmacy to assist in providing this patient's care.   Ramond Dial, Pharm.D, BCPS, CPP [place]  [id number] [place], [place] [address]  Phone: [phone]; Fax: [phone]

## 2020-04-09 LAB — BASIC METABOLIC PANEL
BUN/Creatinine Ratio: 14 (ref 9–23)
BUN: 11 mg/dL (ref 6–24)
CO2: 28 mmol/L (ref 20–29)
Calcium: 9.9 mg/dL (ref 8.7–10.2)
Chloride: 95 mmol/L — ABNORMAL LOW (ref 96–106)
Creatinine, Ser: 0.77 mg/dL (ref 0.57–1.00)
GFR calc Af Amer: 98 mL/min/{1.73_m2} (ref >59)
GFR calc non Af Amer: 85 mL/min/{1.73_m2} (ref >59)
Glucose: 188 mg/dL — ABNORMAL HIGH (ref 65–99)
Potassium: 4.1 mmol/L (ref 3.5–5.2)
Sodium: 138 mmol/L (ref 134–144)

## 2020-04-23 ENCOUNTER — Other Ambulatory Visit: Payer: Self-pay

## 2020-04-23 ENCOUNTER — Telehealth (INDEPENDENT_AMBULATORY_CARE_PROVIDER_SITE_OTHER): Payer: BC Managed Care – PPO | Admitting: Pharmacist

## 2020-04-23 ENCOUNTER — Ambulatory Visit: Payer: BC Managed Care – PPO

## 2020-04-23 VITALS — BP 132/82 | HR 64

## 2020-04-23 DIAGNOSIS — I10 Essential (primary) hypertension: Secondary | ICD-10-CM

## 2020-04-23 MED ORDER — SPIRONOLACTONE 50 MG PO TABS: 50.0000 mg | ORAL_TABLET | Freq: Every day | ORAL | 3 refills | Status: DC

## 2020-04-23 NOTE — Progress Notes (Signed)
Patient ID: Amber Kelly                 DOB: 1961/07/14                      MRN: [id number]     HPI: Amber Kelly is a 59 y.o. female referred by Dr. Irish Lack to HTN clinic. PMH is significant for SVT, HTN, obesity, and T2DM.  At prior appt with HTN clinic (4/13), patient's blood pressure was 140/78 mmHg with pulse of 69 bpm. She had switched to using a upper arm cuff and taking her BP in the PM. Home cuff was compared to clinic and found to be accurate. Home blood pressures were limited, but were much closer to goal. Proper blood pressure checking technique was reviewed. No changes were made. Patient was to increase exercise. BMP on 5/17 was stable.  Follow up today was done via telephone. Patient denies dizziness, lightheadedness, headache, blurred vision, SOB or swelling. She was walking for about 30 min in the evening when she was out of town at her sister in laws. Still hesitant to walk at her house due to the trees and pollen. She has been checking blood pressure while sitting in chair at kitchen table. Did not check while she was away but gives me 3 blood pressure readings.   Current HTN meds: amlodipine/benazepril 10/40 mg once daily (9PM), HCTZ 25 mg daily (8AM), spironolactone 25 mg daily (8AM), carvedilol 6.25 mg twice daily (8AM, 9PM)   Previously tried: valsartan/HCTZ 160/25 mg (changed by provider)  BP goal: < 130/80 mmHg  Family History: Father- diabetes; Mother- HTN  Social History: Retired. No drug or tobacco use. Has 1 alcoholic drink once weekly in summer months; rarely drinks during winter.   Diet: Breakfast- oatmeal or panera bagel w/ 1-2 cups of coffee/tea. Weekend breakfast: eggs, grits, waffles/pancackes. Lunch- Sandwich sometimes with chips and/or fruit; Eats a burger, crabmeat or wings 2-3 times a week. Measures food when adhering to recommended diabetes diet. Does not cook with salt but adds a little after- mainly with cream of wheat, spaghetti, and  grits  Exercise: Takes walks in neighborhood for 30 min-1 hr 4-5 days a week. boflex machine to do arm and stomach exercises. Also does sit-ups.   Home BP readings: 5/30: 132/81 HR 71 5/31: 126/79 HR 69 6/1: 132/82 HR 64  Wt Readings from Last 3 Encounters:  12/20/19 183 lb (83 kg)  11/28/19 172 lb 12.8 oz (78.4 kg)  05/16/13 183 lb (83 kg)   BP Readings from Last 3 Encounters:  04/08/20 140/78  03/05/20 138/84  01/23/20 (!) 146/100   Pulse Readings from Last 3 Encounters:  04/08/20 69  03/05/20 70  01/23/20 76    Renal function: CrCl cannot be calculated (Unknown ideal weight.).  Past Medical History:  Diagnosis Date   BV (bacterial vaginosis)    Diabetes mellitus ([place])    Dysmenorrhea    Fibroids    Hypertension    Menorrhagia     Current Outpatient Medications on File Prior to Visit  Medication Sig Dispense Refill   amLODipine-benazepril (LOTREL) 10-40 MG capsule Take 1 capsule by mouth daily. 30 capsule 11   atorvastatin (LIPITOR) 10 MG tablet Take 1 tablet (10 mg total) by mouth daily. 30 tablet 11   CALCIUM PO Take 1 tablet by mouth daily.     carvedilol (COREG) 6.25 MG tablet Take 1 tablet (6.25 mg total) by mouth 2 (two) times daily.  180 tablet 3   cycloSPORINE (RESTASIS) 0.05 % ophthalmic emulsion Place 1 drop into both eyes 2 (two) times daily.     empagliflozin (JARDIANCE) 10 MG TABS tablet Take 10 mg by mouth daily before breakfast. 30 tablet 0   fluticasone (FLONASE ALLERGY RELIEF) 50 MCG/ACT nasal spray Place 1 spray into both nostrils daily.     hydrochlorothiazide (HYDRODIURIL) 25 MG tablet Take 1 tablet (25 mg total) by mouth daily. 30 tablet 11   KRILL OIL OMEGA-3 PO Take 1 capsule by mouth daily. Mega Red     metFORMIN (GLUCOPHAGE) 500 MG tablet Take 1,000 mg by mouth 2 (two) times daily.     Multiple Vitamin (MULTIVITAMIN WITH MINERALS) TABS Take 1 tablet by mouth daily.     spironolactone (ALDACTONE) 25 MG tablet Take 1 tablet  (25 mg total) by mouth daily. 30 tablet 11   No current facility-administered medications on file prior to visit.    No Known Allergies   Assessment/Plan:  1. Hypertension - BP goal is <130/80 mmHg; therefore, BP remains slightly above goal. Will increase spironolactone to 50mg  daily. Continue amlodipine/benazepril 10/40 mg once daily (9PM), HCTZ 25 mg daily (8AM) and carvedilol 6.25 mg twice daily (8AM, 9PM). Patient will follow up in clinic in 3 weeks (due to pt schedule). Will recheck BMP at the next visit. Patient encouraged to continue to exercise.   Future Considerations -Titrate spironolactone to max tolerated dose (BMET from 01/23/20 stable) -Increase carvedilol from 6.25 mg twice daily to 12.5 mg twice daily -Initiate hydralazine 12.5 mg three times daily.   Thank you for involving pharmacy to assist in providing this patient's care.   Ramond Dial, Pharm.D, BCPS, CPP [place]  [id number] [place], [place] [address]  Phone: [phone]; Fax: [phone]

## 2020-05-12 NOTE — Progress Notes (Signed)
Patient ID: Amber Kelly                 DOB: 07-Aug-1961                      MRN: [id number]     HPI: Amber Kelly is a 59 y.o. female referred by Dr. Irish Lack to HTN clinic. PMH is significant for SVT, HTN, obesity, and T2DM.  At last visit with HTN clinic she had been checking blood pressure while sitting in chair at kitchen table and provided 3 blood pressure readings that were mostly at goal just slightly elevated. Spironolactone was increased from 25 mg to 50 mg daily and other medications remained the same.  Patient presents to the HTN clinic today with several home BP readings that are listed below and range from 122-131/76-88. BP in clinic today is 128/76. She reports tolerating her medications well and not missing any doses. She continues to modify diet and exercise for what works with her lifestyle including eating "bad" foods in moderation. She reports she isn't keen on consistently making medication changes when she is able to adjust her diet and lifestyle to reach her goals. Her technique for checking blood pressure at home was reviewed and is appropriate.   Current HTN meds: amlodipine/benazepril 10/40 mg once daily (9PM), HCTZ 25 mg daily (8AM), spironolactone 50 mg daily (8AM), carvedilol 6.25 mg twice daily (8AM, 9PM)   Previously tried: valsartan/HCTZ 160/25 mg (changed by provider)  BP goal: < 130/80 mmHg  Family History: Father- diabetes; Mother- HTN  Social History: Retired. No drug or tobacco use. Has 1 alcoholic drink once weekly in summer months; rarely drinks during winter.   Diet: Breakfast- oatmeal or panera bagel w/ 1.5 cups of coffee/tea. Weekend breakfast: eggs, grits, waffles/pancackes. Lunch- Sandwich sometimes with chips and/or fruit; Eats a burger, crabmeat or wings 2-3 times a week. Measures food when adhering to recommended diabetes diet. Does not cook with salt but adds a little after- mainly with cream of wheat, spaghetti, and  grits.  Exercise: Takes walks in neighborhood for 30 min-1 hr 4-5 days a week. boflex machine to do arm and stomach exercises. Also does sit-ups.   Home BP readings:  6/13 131/79 HR 69 pm 6/14 129/82 HR 69 pm 6/15 133/82 HR 69 am 6/17 122/76 HR 77 pm 6/20 128/79 HR 67 am 6/20 123/88 HR 72 pm 6/21 130/81 am  Wt Readings from Last 3 Encounters:  12/20/19 183 lb (83 kg)  11/28/19 172 lb 12.8 oz (78.4 kg)  05/16/13 183 lb (83 kg)   BP Readings from Last 3 Encounters:  04/23/20 132/82  04/08/20 140/78  03/05/20 138/84   Pulse Readings from Last 3 Encounters:  04/23/20 64  04/08/20 69  03/05/20 70    Renal function: CrCl cannot be calculated (Patient's most recent lab result is older than the maximum 21 days allowed.).  Past Medical History:  Diagnosis Date  . BV (bacterial vaginosis)   . Diabetes mellitus ([place])   . Dysmenorrhea   . Fibroids   . Hypertension   . Menorrhagia     Current Outpatient Medications on File Prior to Visit  Medication Sig Dispense Refill  . amLODipine-benazepril (LOTREL) 10-40 MG capsule Take 1 capsule by mouth daily. 30 capsule 11  . atorvastatin (LIPITOR) 10 MG tablet Take 1 tablet (10 mg total) by mouth daily. 30 tablet 11  . CALCIUM PO Take 1 tablet by mouth daily.    Marland Kitchen  carvedilol (COREG) 6.25 MG tablet Take 1 tablet (6.25 mg total) by mouth 2 (two) times daily. 180 tablet 3  . cycloSPORINE (RESTASIS) 0.05 % ophthalmic emulsion Place 1 drop into both eyes 2 (two) times daily.    . empagliflozin (JARDIANCE) 10 MG TABS tablet Take 10 mg by mouth daily before breakfast. 30 tablet 0  . fluticasone (FLONASE ALLERGY RELIEF) 50 MCG/ACT nasal spray Place 1 spray into both nostrils daily.    . hydrochlorothiazide (HYDRODIURIL) 25 MG tablet Take 1 tablet (25 mg total) by mouth daily. 30 tablet 11  . KRILL OIL OMEGA-3 PO Take 1 capsule by mouth daily. Mega Red    . metFORMIN (GLUCOPHAGE) 500 MG tablet Take 1,000 mg by mouth 2 (two) times daily.    .  Multiple Vitamin (MULTIVITAMIN WITH MINERALS) TABS Take 1 tablet by mouth daily.    Marland Kitchen spironolactone (ALDACTONE) 50 MG tablet Take 1 tablet (50 mg total) by mouth daily. 90 tablet 3   No current facility-administered medications on file prior to visit.    No Known Allergies   Assessment/Plan:  1. Hypertension - Mostly at goal blood pressure <130/80 on amlodipine/benazepril 10/40 mg once daily, HCTZ 25 mg daily, spironolactone 50 mg daily, and carvedilol 6.25 mg twice daily. A BMP was checked today and we will call her with the results. We will not make changes to medications today given her improved and mostly at goal blood pressure. She will continue to improve her diet and increase her physical activity as tolerated. We will follow up in 4 weeks to ensure blood pressure still controlled at that time.    Eddie Candle, PharmD PGY-1 Pharmacy Resident

## 2020-05-13 ENCOUNTER — Other Ambulatory Visit: Payer: Self-pay

## 2020-05-13 ENCOUNTER — Ambulatory Visit (INDEPENDENT_AMBULATORY_CARE_PROVIDER_SITE_OTHER): Payer: BC Managed Care – PPO | Admitting: Pharmacist

## 2020-05-13 VITALS — BP 128/76 | HR 74

## 2020-05-13 DIAGNOSIS — I10 Essential (primary) hypertension: Secondary | ICD-10-CM | POA: Diagnosis not present

## 2020-05-13 LAB — BASIC METABOLIC PANEL
BUN/Creatinine Ratio: 19 (ref 9–23)
BUN: 16 mg/dL (ref 6–24)
CO2: 22 mmol/L (ref 20–29)
Calcium: 9.7 mg/dL (ref 8.7–10.2)
Chloride: 97 mmol/L (ref 96–106)
Creatinine, Ser: 0.84 mg/dL (ref 0.57–1.00)
GFR calc Af Amer: 88 mL/min/{1.73_m2} (ref >59)
GFR calc non Af Amer: 76 mL/min/{1.73_m2} (ref >59)
Glucose: 230 mg/dL — ABNORMAL HIGH (ref 65–99)
Potassium: 4.1 mmol/L (ref 3.5–5.2)
Sodium: 138 mmol/L (ref 134–144)

## 2020-05-13 NOTE — Patient Instructions (Addendum)
It was nice seeing you today!   Your BP goal is <130/80. Your home BP readings have mostly been at goal and your BP in the office was at goal of 128/76.  Continue taking your current blood pressure medications: amlodipine/benazepril 10/40 mg once daily, HCTZ 25 mg daily, spironolactone 50 mg daily and carvedilol 6.25 mg twice daily  Continue improving your diet and exercise as tolerated.   We will follow up with you on your BMP and see you in clinic for a BP check on July 23rd at 9:30 am.   Call the clinic if you have any questions/concerns [phone]

## 2020-06-14 ENCOUNTER — Other Ambulatory Visit: Payer: Self-pay

## 2020-06-14 ENCOUNTER — Ambulatory Visit (INDEPENDENT_AMBULATORY_CARE_PROVIDER_SITE_OTHER): Payer: BC Managed Care – PPO | Admitting: Pharmacist

## 2020-06-14 VITALS — BP 124/78 | HR 68

## 2020-06-14 DIAGNOSIS — I10 Essential (primary) hypertension: Secondary | ICD-10-CM | POA: Diagnosis not present

## 2020-06-14 NOTE — Progress Notes (Signed)
Patient ID: Amber Kelly                 DOB: 1961-02-28                      MRN: [id number]     HPI: Amber Kelly is a 59 y.o. female referred by Dr. Irish Lack to HTN clinic. PMH is significant for SVT, HTN, obesity, and T2DM.  At last visit with HTN clinic patients blood pressure in clinic was at goal. Her home readings were mostly at goal. No changes were made. BMP was stable.   Patient presents to the HTN clinic today. She denies dizziness, lightheadedness, headache, blurred vision, SOB, swelling, palpitations or chest pain. Has been exercising. Just started back at the [facility]. Was walking at home previously. Home readings are just slightly above goal. She is not resting 5 min. Resting more like 1-2 min. She does have an upper arm cuff that was previously calibrated and found to be accurate. Has been eating cucumber w/ vinegar and salt.  First blood pressure check in clinic 130/76, repeat 124/78  Current HTN meds: amlodipine/benazepril 10/40 mg once daily (9PM), HCTZ 25 mg daily (8AM), spironolactone 50 mg daily (8AM), carvedilol 6.25 mg twice daily (8AM, 9PM)   Previously tried: valsartan/HCTZ 160/25 mg (changed by provider)  BP goal: < 130/80 mmHg  Family History: Father- diabetes; Mother- HTN  Social History: Retired. No drug or tobacco use. Has 1 alcoholic drink once weekly in summer months; rarely drinks during winter.   Diet: Breakfast- oatmeal or panera bagel w/ 1.5 cups of coffee/tea. Weekend breakfast: eggs, grits, waffles/pancackes. Lunch- Sandwich sometimes with chips and/or fruit; Eats a burger, crabmeat or wings 2-3 times a week. Measures food when adhering to recommended diabetes diet. Does not cook with salt but adds a little after- mainly with cream of wheat, spaghetti, and grits.  Exercise:Started back at the [facility] last week-using machines for arm and core, bikes, and walks the track  Home BP readings: 129/82, 134/85, 131/80, 129/84, 130/86, 122/73 HR 68-80  mostly 70's   Wt Readings from Last 3 Encounters:  12/20/19 183 lb (83 kg)  11/28/19 172 lb 12.8 oz (78.4 kg)  05/16/13 183 lb (83 kg)   BP Readings from Last 3 Encounters:  05/13/20 128/76  04/23/20 132/82  04/08/20 140/78   Pulse Readings from Last 3 Encounters:  05/13/20 74  04/23/20 64  04/08/20 69    Renal function: CrCl cannot be calculated (Patient's most recent lab result is older than the maximum 21 days allowed.).  Past Medical History:  Diagnosis Date  . BV (bacterial vaginosis)   . Diabetes mellitus ([place])   . Dysmenorrhea   . Fibroids   . Hypertension   . Menorrhagia     Current Outpatient Medications on File Prior to Visit  Medication Sig Dispense Refill  . amLODipine-benazepril (LOTREL) 10-40 MG capsule Take 1 capsule by mouth daily. 30 capsule 11  . atorvastatin (LIPITOR) 10 MG tablet Take 1 tablet (10 mg total) by mouth daily. 30 tablet 11  . CALCIUM PO Take 1 tablet by mouth daily.    . carvedilol (COREG) 6.25 MG tablet Take 1 tablet (6.25 mg total) by mouth 2 (two) times daily. 180 tablet 3  . cycloSPORINE (RESTASIS) 0.05 % ophthalmic emulsion Place 1 drop into both eyes 2 (two) times daily.    . empagliflozin (JARDIANCE) 10 MG TABS tablet Take 10 mg by mouth daily before breakfast. 30 tablet  0  . fluticasone (FLONASE ALLERGY RELIEF) 50 MCG/ACT nasal spray Place 1 spray into both nostrils daily.    . hydrochlorothiazide (HYDRODIURIL) 25 MG tablet Take 1 tablet (25 mg total) by mouth daily. 30 tablet 11  . KRILL OIL OMEGA-3 PO Take 1 capsule by mouth daily. Mega Red    . metFORMIN (GLUCOPHAGE) 500 MG tablet Take 1,000 mg by mouth 2 (two) times daily.    . Multiple Vitamin (MULTIVITAMIN WITH MINERALS) TABS Take 1 tablet by mouth daily.    Marland Kitchen spironolactone (ALDACTONE) 50 MG tablet Take 1 tablet (50 mg total) by mouth daily. 90 tablet 3   No current facility-administered medications on file prior to visit.    No Known  Allergies   Assessment/Plan:  1. Hypertension - Blood pressure on second check in clinic is at goal of <130/80. Home blood pressures are just above goal. Patient is not resting a full 5 min. Discussed increasing spironolactone to 75mg  daily or working on diet and resting a little longer before checking blood pressure are home. Patient preferred the latter. Will follow up via telephone in 5 weeks. Encouraged her to limit salt intake.   Ramond Dial, Pharm.D, BCPS, CPP [place]  [address] [place], [place] [address]  Phone: [phone]; Fax: [phone]

## 2020-06-14 NOTE — Patient Instructions (Addendum)
Please continue taking amlodipine/benazepril 10/40 mg once daily (9PM), HCTZ 25 mg daily (8AM), spironolactone 50 mg daily (8AM), carvedilol 6.25 mg twice daily (8AM, 9PM)   Continue checking blood pressure at home.  Follow up in 1 month.  Before checking your blood pressure make sure: You are seated and quite for 5 min before checking Feet are flat on the floor Siting in chair with your back supported straight up and down Arm resting on table or arm of chair at heart level Bladder is empty You have NOT had caffeine or tobacco within the last 30 min  Check your blood pressure 2-3 times about 1-2 min apart. Usually the first reading will be the highest. Record these readings.  Only check your blood pressure once a day, unless otherwise directed Record your blood pressure readings and bring them to all your appointments. If your meter stores your readings in its memory, then you may bring your blood pressure meter with you to your appointments.  You can find a list of validated (accurate) blood pressure cuffs at [employer]Path.it  Lifestyle changes can make a world of difference and are even more important than medications: Try to keep your sodium intake to 2300 mg of sodium per day Get 6-8 uninterrupted hours of sleep per night Aim for a goal of 150 min of moderate aerobic exercise (ie brisk walking, bike riding) per week

## 2020-07-19 ENCOUNTER — Telehealth: Payer: BC Managed Care – PPO

## 2020-08-02 ENCOUNTER — Telehealth (INDEPENDENT_AMBULATORY_CARE_PROVIDER_SITE_OTHER): Payer: BC Managed Care – PPO | Admitting: Pharmacist

## 2020-08-02 ENCOUNTER — Other Ambulatory Visit: Payer: Self-pay

## 2020-08-02 VITALS — BP 130/81 | HR 62

## 2020-08-02 DIAGNOSIS — I10 Essential (primary) hypertension: Secondary | ICD-10-CM

## 2020-08-02 NOTE — Progress Notes (Signed)
Patient ID: Amber Kelly                 DOB: [birth year]                      MRN: [id number]     HPI: Amber Kelly is a 59 y.o. female referred by Dr. Irish Lack to HTN clinic. PMH is significant for SVT, HTN, obesity, and T2DM.  At last visit with HTN clinic on 7/23, patients blood pressure in clinic was at goal. Her home readings varied with some at goal and some just above goal. No changes were made as patient was not resting a full 5 min before checking her blood pressure and she desired to work on lifestyle first.   Today's visit was done via telephone. Patient reports feeling good other than some drainage of her sinuses. She checked her blood pressure a few times this week. Average of readings was 122/78. She only waits a min or two after sitting down to check blood pressure. She denies dizziness, lightheadedness, headache, blurred vision, SOB or swelling. Has been walking 30 min most mornings. Has not gone to [facility] much in the past month due to concerns about COVID.  She does have an upper arm cuff that was previously calibrated and found to be accurate.    Current HTN meds: amlodipine/benazepril 10/40 mg once daily (9PM), HCTZ 25 mg daily (8AM), spironolactone 50 mg daily (8AM), carvedilol 6.25 mg twice daily (8AM, 9PM)   Previously tried: valsartan/HCTZ 160/25 mg (changed by provider)  BP goal: < 130/80 mmHg  Family History: Father- diabetes; Mother- HTN  Social History: Retired. No drug or tobacco use. Has 1 alcoholic drink once weekly in summer months; rarely drinks during winter.   Diet: Breakfast- oatmeal or panera bagel w/ 1.5 cups of coffee/tea. Weekend breakfast: eggs, grits, waffles/pancackes. Lunch- Sandwich sometimes with chips and/or fruit; Eats a burger, crabmeat or wings 2-3 times a week. Measures food when adhering to recommended diabetes diet. Does not cook with salt but adds a little after- mainly with cream of wheat, spaghetti, and  grits.  Exercise:Started back at the [facility] last week-using machines for arm and core, bikes, and walks the track Walks 30 min at home most days of the week  Home BP readings: 130/86, 122/73, 108/76 HR 74 134/82 HR 74, 107/72  Wt Readings from Last 3 Encounters:  12/20/19 183 lb (83 kg)  11/28/19 172 lb 12.8 oz (78.4 kg)  05/16/13 183 lb (83 kg)   BP Readings from Last 3 Encounters:  06/14/20 124/78  05/13/20 128/76  04/23/20 132/82   Pulse Readings from Last 3 Encounters:  06/14/20 68  05/13/20 74  04/23/20 64    Renal function: CrCl cannot be calculated (Patient's most recent lab result is older than the maximum 21 days allowed.).  Past Medical History:  Diagnosis Date  . BV (bacterial vaginosis)   . Diabetes mellitus ([place])   . Dysmenorrhea   . Fibroids   . Hypertension   . Menorrhagia     Current Outpatient Medications on File Prior to Visit  Medication Sig Dispense Refill  . amLODipine-benazepril (LOTREL) 10-40 MG capsule Take 1 capsule by mouth daily. 30 capsule 11  . atorvastatin (LIPITOR) 10 MG tablet Take 1 tablet (10 mg total) by mouth daily. 30 tablet 11  . CALCIUM PO Take 1 tablet by mouth daily.    . carvedilol (COREG) 6.25 MG tablet Take 1 tablet (6.25 mg total) by  mouth 2 (two) times daily. 180 tablet 3  . cycloSPORINE (RESTASIS) 0.05 % ophthalmic emulsion Place 1 drop into both eyes 2 (two) times daily.    . empagliflozin (JARDIANCE) 10 MG TABS tablet Take 10 mg by mouth daily before breakfast. 30 tablet 0  . fluticasone (FLONASE ALLERGY RELIEF) 50 MCG/ACT nasal spray Place 1 spray into both nostrils daily. (Patient not taking: Reported on 06/14/2020)    . hydrochlorothiazide (HYDRODIURIL) 25 MG tablet Take 1 tablet (25 mg total) by mouth daily. 30 tablet 11  . KRILL OIL OMEGA-3 PO Take 1 capsule by mouth daily. Mega Red    . metFORMIN (GLUCOPHAGE) 500 MG tablet Take 500 mg by mouth 2 (two) times daily.     . Multiple Vitamin (MULTIVITAMIN WITH MINERALS)  TABS Take 1 tablet by mouth daily.    Marland Kitchen spironolactone (ALDACTONE) 50 MG tablet Take 1 tablet (50 mg total) by mouth daily. 90 tablet 3   No current facility-administered medications on file prior to visit.    No Known Allergies   Assessment/Plan:  1. Hypertension - Blood pressures are at or just above goal. Patient does not rest 5 min before taking. Blood pressure average this week was well below goal. Continue amlodipine/benazepril 10/40 mg once daily (9PM), HCTZ 25 mg daily (8AM), spironolactone 50 mg daily (8AM), carvedilol 6.25 mg twice daily (8AM, 9PM). Continue exercise routine. Follow up as needed.  Thanks,  Ramond Dial, Pharm.D, BCPS, CPP [place]  [address], [place], [place] [address]  Phone: [phone]; Fax: [phone]

## 2020-12-06 ENCOUNTER — Other Ambulatory Visit: Payer: Self-pay

## 2020-12-06 MED ORDER — CARVEDILOL 6.25 MG PO TABS: 6.2500 mg | ORAL_TABLET | Freq: Two times a day (BID) | ORAL | 0 refills | Status: DC

## 2020-12-06 NOTE — Telephone Encounter (Signed)
Pt's medication was sent to pt's pharmacy as requested. Confirmation received.  °

## 2021-01-08 ENCOUNTER — Other Ambulatory Visit: Payer: Self-pay | Admitting: Interventional Cardiology

## 2021-01-09 ENCOUNTER — Other Ambulatory Visit: Payer: Self-pay

## 2021-01-09 DIAGNOSIS — I10 Essential (primary) hypertension: Secondary | ICD-10-CM

## 2021-01-09 MED ORDER — HYDROCHLOROTHIAZIDE 25 MG PO TABS: 25.0000 mg | ORAL_TABLET | Freq: Every day | ORAL | 0 refills | Status: DC

## 2021-01-15 NOTE — Progress Notes (Signed)
Cardiology Office Note   Date:  01/16/2021   ID:  Amber Kelly, DOB 02-18-1961, MRN [id number]  PCP:  Lennie Odor, PA    No chief complaint on file.  SVT  Wt Readings from Last 3 Encounters:  01/16/21 167 lb 12.8 oz (76.1 kg)  12/20/19 183 lb (83 kg)  11/28/19 172 lb 12.8 oz (78.4 kg)       History of Present Illness: Amber Kelly is a 60 y.o. female  Who has had palpitations.  Monitor in 09/2019 showed:  "Normal sinus rhythm  PAC's and rare PVC's are moted  Brief SVT with longest 12 beats  Symptoms correlate with brief SVT and premature beats.  No atrial fibrillation or VT noted  Symptoms correlated with premature beats and brief SVT"  Since November 2020, she has felt well.  Since the monitor, she has been more strict with her diabetic diet and this seemed to helped with her palpitations.  Her A1C wsa 7.4 on 09/22/2019.  Denies : Chest pain. Dizziness. Leg edema. Nitroglycerin use. Orthopnea. Palpitations. Paroxysmal nocturnal dyspnea. Shortness of breath. Syncope.   A1C has increased, but she is decreasing soda intake.  2 sodas/week, alcohol 1x /week.    Exercising at the [facility].       Past Medical History:  Diagnosis Date  . BV (bacterial vaginosis)   . Diabetes mellitus ([place])   . Dysmenorrhea   . Fibroids   . Hypertension   . Menorrhagia     Past Surgical History:  Procedure Laterality Date  . ABDOMINAL HYSTERECTOMY Bilateral 05/16/2013   Procedure: HYSTERECTOMY ABDOMINAL WITH BILATERAL SALPINGECTOMY;  Surgeon: Eldred Manges, MD;  Location: [facility];  Service: Gynecology;  Laterality: Bilateral;  . CESAREAN SECTION    . TUBAL LIGATION  1996     Current Outpatient Medications  Medication Sig Dispense Refill  . amLODipine-benazepril (LOTREL) 10-40 MG capsule Take 1 capsule by mouth daily. 30 capsule 11  . atorvastatin (LIPITOR) 10 MG tablet Take 1 tablet (10 mg total) by mouth daily. 30 tablet 11  . CALCIUM PO Take 1 tablet by  mouth daily.    . carvedilol (COREG) 6.25 MG tablet Take 1 tablet (6.25 mg total) by mouth 2 (two) times daily with a meal. Please keep upcoming appt in February 2022 with Dr. Irish Lack before anymore refills. Thank you 60 tablet 0  . cycloSPORINE (RESTASIS) 0.05 % ophthalmic emulsion Place 1 drop into both eyes 2 (two) times daily.    . empagliflozin (JARDIANCE) 25 MG TABS tablet Take 25 mg by mouth daily.    . fluticasone (FLONASE) 50 MCG/ACT nasal spray Place 1 spray into both nostrils daily.    . hydrochlorothiazide (HYDRODIURIL) 25 MG tablet Take 1 tablet (25 mg total) by mouth daily. Please keep upcoming appt in February 2022 with Dr. Irish Lack for future refills. Thank you 30 tablet 0  . KRILL OIL OMEGA-3 PO Take 1 capsule by mouth daily. Mega Red    . metFORMIN (GLUCOPHAGE) 500 MG tablet Take 500 mg by mouth 2 (two) times daily.     . Multiple Vitamin (MULTIVITAMIN WITH MINERALS) TABS Take 1 tablet by mouth daily.    Marland Kitchen spironolactone (ALDACTONE) 50 MG tablet Take 1 tablet (50 mg total) by mouth daily. 90 tablet 3   No current facility-administered medications for this visit.    Allergies:   Patient has no known allergies.    Social History:  The patient  reports that she has never smoked.  She has never used smokeless tobacco. She reports that she does not drink alcohol and does not use drugs.   Family History:  The patient's family history includes Diabetes in her father; Hypertension in her mother.    ROS:  Please see the history of present illness.   Otherwise, review of systems are positive for difficult to control DM; seasonal allergies walking outside.   All other systems are reviewed and negative.    PHYSICAL EXAM: VS:  BP 130/82   Pulse 71   Ht 5\' 2"  (1.575 m)   Wt 167 lb 12.8 oz (76.1 kg)   LMP 05/09/2013   SpO2 96%   BMI 30.69 kg/m  , BMI Body mass index is 30.69 kg/m. GEN: Well nourished, well developed, in no acute distress  HEENT: normal  Neck: no JVD, carotid  bruits, or masses Cardiac: RRR; no murmurs, rubs, or gallops,no edema  Respiratory:  clear to auscultation bilaterally, normal work of breathing GI: soft, nontender, nondistended, + BS MS: no deformity or atrophy  Skin: warm and dry, no rash Neuro:  Strength and sensation are intact Psych: euthymic mood, full affect   EKG:   The ekg ordered today demonstrates normal ECG   Recent Labs: 05/13/2020: BUN 16; Creatinine, Ser 0.84; Potassium 4.1; Sodium 138   Lipid Panel No results found for: CHOL, TRIG, HDL, CHOLHDL, VLDL, LDLCALC, LDLDIRECT   Other studies Reviewed: Additional studies/ records that were reviewed today with results demonstrating: labs reviewed.   ASSESSMENT AND PLAN:  1.   SVT: Symptoms of palpitations have been controlled.  Continue carvedilol.  Avoid caffeinated drinks. 2.   HTN: Low-salt diet.  Whole food, plant-based diet recommended. 3.   Obesity: Regular exercise along with dietary recommendations above will be helpful for weight loss.   4.   OSA: CPAP per Dr. Radford Pax. DM: A1C 8.9. GOing to the [facility] for exercise.  Jardiance, Metformin are beneficial for heart health. 5.  LDL 78 in 11/21.  COntinue atorvastatin.    Current medicines are reviewed at length with the patient today.  The patient concerns regarding her medicines were addressed.  The following changes have been made:  No change  Labs/ tests ordered today include:  No orders of the defined types were placed in this encounter.   Recommend 150 minutes/week of aerobic exercise Low fat, low carb, high fiber diet recommended  Disposition:   FU in 1 year   Signed, Larae Grooms, MD  01/16/2021 9:25 AM    [facility] [address] [place], [place], [place]  [address] Phone: [phone]; Fax: [phone]

## 2021-01-16 ENCOUNTER — Other Ambulatory Visit: Payer: Self-pay

## 2021-01-16 ENCOUNTER — Encounter: Payer: Self-pay | Admitting: Interventional Cardiology

## 2021-01-16 ENCOUNTER — Ambulatory Visit: Payer: BC Managed Care – PPO | Admitting: Interventional Cardiology

## 2021-01-16 VITALS — BP 130/82 | HR 71 | Ht 62.0 in | Wt 167.8 lb

## 2021-01-16 DIAGNOSIS — G4730 Sleep apnea, unspecified: Secondary | ICD-10-CM | POA: Diagnosis not present

## 2021-01-16 DIAGNOSIS — E669 Obesity, unspecified: Secondary | ICD-10-CM | POA: Diagnosis not present

## 2021-01-16 DIAGNOSIS — I471 Supraventricular tachycardia: Secondary | ICD-10-CM | POA: Diagnosis not present

## 2021-01-16 DIAGNOSIS — I10 Essential (primary) hypertension: Secondary | ICD-10-CM

## 2021-01-16 MED ORDER — HYDROCHLOROTHIAZIDE 25 MG PO TABS: 25.0000 mg | ORAL_TABLET | Freq: Every day | ORAL | 11 refills | Status: DC

## 2021-01-16 MED ORDER — SPIRONOLACTONE 50 MG PO TABS: 50.0000 mg | ORAL_TABLET | Freq: Every day | ORAL | 11 refills | Status: DC

## 2021-01-16 MED ORDER — ATORVASTATIN CALCIUM 10 MG PO TABS: 10.0000 mg | ORAL_TABLET | Freq: Every day | ORAL | 11 refills | Status: DC

## 2021-01-16 MED ORDER — CARVEDILOL 6.25 MG PO TABS: 6.2500 mg | ORAL_TABLET | Freq: Two times a day (BID) | ORAL | 11 refills | Status: DC

## 2021-01-16 MED ORDER — AMLODIPINE BESY-BENAZEPRIL HCL 10-40 MG PO CAPS: 1.0000 | ORAL_CAPSULE | Freq: Every day | ORAL | 11 refills | Status: DC

## 2021-01-16 NOTE — Patient Instructions (Signed)
Medication Instructions:  Your physician recommends that you continue on your current medications as directed. Please refer to the Current Medication list given to you today.  *If you need a refill on your cardiac medications before your next appointment, please call your pharmacy*   Lab Work: none If you have labs (blood work) drawn today and your tests are completely normal, you will receive your results only by: . MyChart Message (if you have MyChart) OR . A paper copy in the mail If you have any lab test that is abnormal or we need to change your treatment, we will call you to review the results.   Testing/Procedures: none   Follow-Up: At [facility], you and your health needs are our priority.  As part of our continuing mission to provide you with exceptional heart care, we have created designated Provider Care Teams.  These Care Teams include your primary Cardiologist (physician) and Advanced Practice Providers (APPs -  Physician Assistants and Nurse Practitioners) who all work together to provide you with the care you need, when you need it.  We recommend signing up for the patient portal called "MyChart".  Sign up information is provided on this After Visit Summary.  MyChart is used to connect with patients for Virtual Visits (Telemedicine).  Patients are able to view lab/test results, encounter notes, upcoming appointments, etc.  Non-urgent messages can be sent to your provider as well.   To learn more about what you can do with MyChart, go to [email or web address].    Your next appointment:   12 month(s)  The format for your next appointment:   In Person  Provider:   You may see Jayadeep Varanasi, MD or one of the following Advanced Practice Providers on your designated Care Team:    Dayna Dunn, PA-C  Michele Lenze, PA-C    Other Instructions  High-Fiber Eating Plan Fiber, also called dietary fiber, is a type of carbohydrate. It is found foods such as fruits,  vegetables, whole grains, and beans. A high-fiber diet can have many health benefits. Your health care provider may recommend a high-fiber diet to help:  Prevent constipation. Fiber can make your bowel movements more regular.  Lower your cholesterol.  Relieve the following conditions: ? Inflammation of veins in the anus (hemorrhoids). ? Inflammation of specific areas of the digestive tract (uncomplicated diverticulosis). ? A problem of the large intestine, also called the colon, that sometimes causes pain and diarrhea (irritable bowel syndrome, or IBS).  Prevent overeating as part of a weight-loss plan.  Prevent heart disease, type 2 diabetes, and certain cancers. What are tips for following this plan? Reading food labels  Check the nutrition facts label on food products for the amount of dietary fiber. Choose foods that have 5 grams of fiber or more per serving.  The goals for recommended daily fiber intake include: ? Men (age 50 or younger): 34-38 g. ? Men (over age 50): 28-34 g. ? Women (age 50 or younger): 25-28 g. ? Women (over age 50): 22-25 g. Your daily fiber goal is _____________ g.   Shopping  Choose whole fruits and vegetables instead of processed forms, such as apple juice or applesauce.  Choose a wide variety of high-fiber foods such as avocados, lentils, oats, and kidney beans.  Read the nutrition facts label of the foods you choose. Be aware of foods with added fiber. These foods often have high sugar and sodium amounts per serving. Cooking  Use whole-grain flour for baking and cooking.    Cook with brown rice instead of white rice. Meal planning  Start the day with a breakfast that is high in fiber, such as a cereal that contains 5 g of fiber or more per serving.  Eat breads and cereals that are made with whole-grain flour instead of refined flour or white flour.  Eat brown rice, bulgur wheat, or millet instead of white rice.  Use beans in place of meat in  soups, salads, and pasta dishes.  Be sure that half of the grains you eat each day are whole grains. General information  You can get the recommended daily intake of dietary fiber by: ? Eating a variety of fruits, vegetables, grains, nuts, and beans. ? Taking a fiber supplement if you are not able to take in enough fiber in your diet. It is better to get fiber through food than from a supplement.  Gradually increase how much fiber you consume. If you increase your intake of dietary fiber too quickly, you may have bloating, cramping, or gas.  Drink plenty of water to help you digest fiber.  Choose high-fiber snacks, such as berries, raw vegetables, nuts, and popcorn. What foods should I eat? Fruits Berries. Pears. Apples. Oranges. Avocado. Prunes and raisins. Dried figs. Vegetables Sweet potatoes. Spinach. Kale. Artichokes. Cabbage. Broccoli. Cauliflower. Green peas. Carrots. Squash. Grains Whole-grain breads. Multigrain cereal. Oats and oatmeal. Brown rice. Barley. Bulgur wheat. Millet. Quinoa. Bran muffins. Popcorn. Rye wafer crackers. Meats and other proteins Navy beans, kidney beans, and pinto beans. Soybeans. Split peas. Lentils. Nuts and seeds. Dairy Fiber-fortified yogurt. Beverages Fiber-fortified soy milk. Fiber-fortified orange juice. Other foods Fiber bars. The items listed above may not be a complete list of recommended foods and beverages. Contact a dietitian for more information. What foods should I avoid? Fruits Fruit juice. Cooked, strained fruit. Vegetables Fried potatoes. Canned vegetables. Well-cooked vegetables. Grains White bread. Pasta made with refined flour. White rice. Meats and other proteins Fatty cuts of meat. Fried chicken or fried fish. Dairy Milk. Yogurt. Cream cheese. Sour cream. Fats and oils Butters. Beverages Soft drinks. Other foods Cakes and pastries. The items listed above may not be a complete list of foods and beverages to avoid.  Talk with your dietitian about what choices are best for you. Summary  Fiber is a type of carbohydrate. It is found in foods such as fruits, vegetables, whole grains, and beans.  A high-fiber diet has many benefits. It can help to prevent constipation, lower blood cholesterol, aid weight loss, and reduce your risk of heart disease, diabetes, and certain cancers.  Increase your intake of fiber gradually. Increasing fiber too quickly may cause cramping, bloating, and gas. Drink plenty of water while you increase the amount of fiber you consume.  The best sources of fiber include whole fruits and vegetables, whole grains, nuts, seeds, and beans. This information is not intended to replace advice given to you by your health care provider. Make sure you discuss any questions you have with your health care provider. Document Revised: 03/14/2020 Document Reviewed: 03/14/2020 Elsevier Patient Education  2021 Elsevier Inc.   

## 2021-02-05 ENCOUNTER — Other Ambulatory Visit: Payer: Self-pay | Admitting: Interventional Cardiology

## 2021-02-05 DIAGNOSIS — I10 Essential (primary) hypertension: Secondary | ICD-10-CM

## 2022-01-16 ENCOUNTER — Other Ambulatory Visit: Payer: Self-pay | Admitting: Interventional Cardiology

## 2022-01-16 DIAGNOSIS — I10 Essential (primary) hypertension: Secondary | ICD-10-CM

## 2022-02-12 ENCOUNTER — Other Ambulatory Visit: Payer: Self-pay | Admitting: Interventional Cardiology

## 2022-02-13 ENCOUNTER — Other Ambulatory Visit: Payer: Self-pay | Admitting: Interventional Cardiology

## 2022-03-10 ENCOUNTER — Ambulatory Visit: Payer: BC Managed Care – PPO | Admitting: Physician Assistant

## 2022-03-10 ENCOUNTER — Encounter: Payer: Self-pay | Admitting: Dermatology

## 2022-03-10 DIAGNOSIS — L719 Rosacea, unspecified: Secondary | ICD-10-CM

## 2022-03-10 DIAGNOSIS — B079 Viral wart, unspecified: Secondary | ICD-10-CM

## 2022-03-10 DIAGNOSIS — B360 Pityriasis versicolor: Secondary | ICD-10-CM

## 2022-03-10 MED ORDER — CICLOPIROX OLAMINE 0.77 % EX SUSP: CUTANEOUS | 6 refills | Status: DC

## 2022-03-10 MED ORDER — KETOCONAZOLE 2 % EX CREA: TOPICAL_CREAM | CUTANEOUS | 6 refills | Status: AC

## 2022-03-10 MED ORDER — MINOCYCLINE HCL 100 MG PO CAPS: 100.0000 mg | ORAL_CAPSULE | Freq: Every day | ORAL | 4 refills | Status: AC

## 2022-03-10 NOTE — Progress Notes (Signed)
? ?  New Patient ?  ?Subjective  ?Amber Kelly is a 61 y.o. female who presents for the following: New Patient (Initial Visit) (Patient here today for breaking out on her face x 1 year per patient it's now spreading. Patient also wants to know if there's anything else she can do for tinea versicolor she's currently using Ketoconazole shampoo. Patient also has a possible wart on her left pinky finger x 1 year she's tried OTC wart treatment with no improvement. ). ? ? ?The following portions of the chart were reviewed this encounter and updated as appropriate:  Tobacco  Allergies  Meds  Problems  Med Hx  Surg Hx  Fam Hx   ?  ? ?Objective  ?Well appearing patient in no apparent distress; mood and affect are within normal limits. ? ?All skin waist up examined. ? ?Mid Parietal Scalp, Torso - Posterior (Back) ?Dark crusted oval patches ? ?Left Malar Cheek, Right Malar Cheek ?Centrifacial erythema with papules/pustules.  ? ?Left 5th Finger Nail Plate ?Verrucous papules -- Discussed viral etiology and contagion. Also discussed the risk of permanent nail damage. Fifth fingernail has numerous undulations.  ? ? ? ? ? ?Assessment & Plan  ?Tinea versicolor ?Torso - Posterior (Back); Mid Parietal Scalp ? ?ciclopirox (LOPROX) 0.77 % SUSP - Mid Parietal Scalp, Torso - Posterior (Back) ?Aaa qd ? ?ketoconazole (NIZORAL) 2 % cream - Mid Parietal Scalp, Torso - Posterior (Back) ?Aaa qd ? ?Rosacea ?Left Malar Cheek; Right Malar Cheek ? ?minocycline (MINOCIN) 100 MG capsule - Left Malar Cheek, Right Malar Cheek ?Take 1 capsule (100 mg total) by mouth daily. ? ?Viral warts, unspecified type ?Left 5th Finger Nail Plate ? ?Home DCA ? ? ? ? ?I, Taygan Connell, PA-C, have reviewed all documentation's for this visit.  The documentation on 03/10/22 for the exam, diagnosis, procedures and orders are all accurate and complete. ?

## 2022-03-11 ENCOUNTER — Telehealth: Payer: Self-pay | Admitting: Physician Assistant

## 2022-03-11 NOTE — Telephone Encounter (Signed)
Patient was calling - forgot where to use what medication-all questions answered - told her to call back if needed.  ?

## 2022-03-11 NOTE — Telephone Encounter (Signed)
Patient is calling to say that she has questions about ?ketoconazole ?ketoconazole (NIZORAL) 2 % cream ? ?And ?minocycline ?minocycline (MINOCIN) 100 MG capsule ? ?And ? ?ciclopirox ?ciclopirox (LOPROX) 0.77 % SUSP ? ?Patient has questions about each of these medications. ?

## 2022-03-16 ENCOUNTER — Telehealth: Payer: Self-pay | Admitting: Physician Assistant

## 2022-03-16 NOTE — Telephone Encounter (Signed)
Patient is calling to get clarification on how to use ? ?ciclopirox ?ciclopirox (LOPROX) 0.77 % SUSP  and ? ?ketoconazole ?ketoconazole (NIZORAL) 2 % cream ? ?

## 2022-03-16 NOTE — Telephone Encounter (Signed)
Phone call to patient because she had some questions on her medication. Gave her instructions on how to use them.  ?

## 2022-03-17 LAB — ANAEROBIC AND AEROBIC CULTURE
MICRO NUMBER:: 13278423
MICRO NUMBER:: 13278424
SPECIMEN QUALITY:: ADEQUATE
SPECIMEN QUALITY:: ADEQUATE

## 2022-03-20 ENCOUNTER — Encounter: Payer: Self-pay | Admitting: Interventional Cardiology

## 2022-03-20 ENCOUNTER — Ambulatory Visit: Payer: BC Managed Care – PPO | Admitting: Interventional Cardiology

## 2022-03-20 VITALS — BP 120/80 | HR 70 | Ht 62.0 in | Wt 163.0 lb

## 2022-03-20 DIAGNOSIS — I10 Essential (primary) hypertension: Secondary | ICD-10-CM

## 2022-03-20 DIAGNOSIS — I471 Supraventricular tachycardia, unspecified: Secondary | ICD-10-CM

## 2022-03-20 DIAGNOSIS — G4730 Sleep apnea, unspecified: Secondary | ICD-10-CM | POA: Diagnosis not present

## 2022-03-20 DIAGNOSIS — E669 Obesity, unspecified: Secondary | ICD-10-CM | POA: Diagnosis not present

## 2022-03-20 DIAGNOSIS — E66811 Obesity, class 1: Secondary | ICD-10-CM

## 2022-03-20 NOTE — Progress Notes (Signed)
?  ?Cardiology Office Note ? ? ?Date:  03/20/2022  ? ?ID:  Amber Kelly, DOB 10/10/1961, MRN [id number] ? ?PCP:  Lennie Odor, PA  ? ? ?No chief complaint on file. ? ?SVT ? ?Wt Readings from Last 3 Encounters:  ?03/20/22 163 lb (73.9 kg)  ?01/16/21 167 lb 12.8 oz (76.1 kg)  ?12/20/19 183 lb (83 kg)  ?  ? ?  ?History of Present Illness: ?Amber Kelly is a 61 y.o. female    Who has had palpitations. ?  ?Monitor in 09/2019 showed: ?"Normal sinus rhythm ?PAC's and rare PVC's are moted ?Brief SVT with longest 12 beats ?Symptoms correlate with brief SVT and premature beats. ?No atrial fibrillation or VT noted ?  ?Symptoms correlated with premature beats and brief SVT" ? ?Decreasing soda intake to help lower A1C in the past.  ? ?Walking for exercise a the [employer], almost daily.  1 mile walk takes her < 30 minutes. Does some weight as well. Does the bike as well.  ? ?Denies : Chest pain. Dizziness. Leg edema. Nitroglycerin use. Orthopnea. Palpitations. Paroxysmal nocturnal dyspnea. Shortness of breath. Syncope.   ? ? ?Past Medical History:  ?Diagnosis Date  ? BV (bacterial vaginosis)   ? Diabetes mellitus ([place])   ? Dysmenorrhea   ? Fibroids   ? Hypertension   ? Menorrhagia   ? ? ?Past Surgical History:  ?Procedure Laterality Date  ? ABDOMINAL HYSTERECTOMY Bilateral 05/16/2013  ? Procedure: HYSTERECTOMY ABDOMINAL WITH BILATERAL SALPINGECTOMY;  Surgeon: Eldred Manges, MD;  Location: [facility];  Service: Gynecology;  Laterality: Bilateral;  ? CESAREAN SECTION    ? TUBAL LIGATION  1996  ? ? ? ?Current Outpatient Medications  ?Medication Sig Dispense Refill  ? amLODipine-benazepril (LOTREL) 10-40 MG capsule Take 1 capsule by mouth daily. 30 capsule 11  ? atorvastatin (LIPITOR) 40 MG tablet Take 40 mg by mouth daily.    ? CALCIUM PO Take 1 tablet by mouth daily.    ? carvedilol (COREG) 6.25 MG tablet TAKE 1 TABLET(6.25 MG) BY MOUTH TWICE DAILY WITH A MEAL 60 tablet 3  ? ciclopirox (LOPROX) 0.77 % SUSP Aaa qd 60 mL 6  ?  cycloSPORINE (RESTASIS) 0.05 % ophthalmic emulsion Place 1 drop into both eyes 2 (two) times daily.    ? empagliflozin (JARDIANCE) 25 MG TABS tablet Take 25 mg by mouth daily.    ? fluticasone (FLONASE) 50 MCG/ACT nasal spray Place 1 spray into both nostrils as needed.    ? hydrochlorothiazide (HYDRODIURIL) 25 MG tablet TAKE 1 TABLET(25 MG) BY MOUTH DAILY 30 tablet 3  ? ketoconazole (NIZORAL) 2 % cream Aaa qd 60 g 6  ? KRILL OIL OMEGA-3 PO Take 1 capsule by mouth daily. Mega Red    ? minocycline (MINOCIN) 100 MG capsule Take 1 capsule (100 mg total) by mouth daily. 30 capsule 4  ? Multiple Vitamin (MULTIVITAMIN WITH MINERALS) TABS Take 1 tablet by mouth daily.    ? RYBELSUS 7 MG TABS Take 1 tablet by mouth every morning. Per patient taking 1/2 tablet daily    ? spironolactone (ALDACTONE) 50 MG tablet TAKE 1 TABLET(50 MG) BY MOUTH DAILY 30 tablet 1  ? atorvastatin (LIPITOR) 10 MG tablet Take 1 tablet (10 mg total) by mouth daily. (Patient not taking: Reported on 03/20/2022) 30 tablet 11  ? ?No current facility-administered medications for this visit.  ? ? ?Allergies:   Metformin  ? ? ?Social History:  The patient  reports that she has never  smoked. She has never used smokeless tobacco. She reports that she does not drink alcohol and does not use drugs.  ? ?Family History:  The patient's family history includes Diabetes in her father; Hypertension in her mother.  ? ? ?ROS:  Please see the history of present illness.   Otherwise, review of systems are positive for " sweet tooth."  All other systems are reviewed and negative.  ? ? ?PHYSICAL EXAM: ?VS:  BP 120/80   Pulse 70   Ht '5\' 2"'$  (1.575 m)   Wt 163 lb (73.9 kg)   LMP 05/09/2013   SpO2 99%   BMI 29.81 kg/m?  , BMI Body mass index is 29.81 kg/m?. ?GEN: Well nourished, well developed, in no acute distress ?HEENT: normal ?Neck: no JVD, carotid bruits, or masses ?Cardiac: RRR; no murmurs, rubs, or gallops,no edema  ?Respiratory:  clear to auscultation bilaterally,  normal work of breathing ?GI: soft, nontender, nondistended, + BS ?MS: no deformity or atrophy ?Skin: warm and dry, no rash ?Neuro:  Strength and sensation are intact ?Psych: euthymic mood, full affect ? ? ?EKG:   ?The ekg ordered today demonstrates normal ECG ? ? ?Recent Labs: ?No results found for requested labs within last 8760 hours.  ? ?Lipid Panel ?No results found for: CHOL, TRIG, HDL, CHOLHDL, VLDL, LDLCALC, LDLDIRECT ?  ?Other studies Reviewed: ?Additional studies/ records that were reviewed today with results demonstrating: Labs reviewed.  LDL 83 in February 2023 HDL 69 triglycerides 54 total cholesterol 162. ? ? ?ASSESSMENT AND PLAN: ? ?SVT: Sx controlled.  No recent sx. continue beta-blocker ?HTN: Low-salt diet.  Avoid processed foods.  676H systolic at home.  Normal after walking as well.  The current medical regimen is effective;  continue present plan and medications. ?Obesity:  Intentional weight loss with exercise and diet. ?OSA: Weight loss should help.  ?Hyperlipidemia: Whole food, plant-based diet. ?DM: A1C 8.7%.  Increase intensity of exercise.  Discussed talk test for walking.  ? ? ?Current medicines are reviewed at length with the patient today.  The patient concerns regarding her medicines were addressed. ? ?The following changes have been made:  No change ? ?Labs/ tests ordered today include:  ?No orders of the defined types were placed in this encounter. ? ? ?Recommend 150 minutes/week of aerobic exercise ?Low fat, low carb, high fiber diet recommended ? ?Disposition:   FU in 1 year ? ? ?Signed, ?Larae Grooms, MD  ?03/20/2022 9:23 AM    ?[facility] ?[address], [place], [place]  [address] ?Phone: [phone]; Fax: [phone]  ? ?

## 2022-03-20 NOTE — Patient Instructions (Signed)
Medication Instructions:  Your physician recommends that you continue on your current medications as directed. Please refer to the Current Medication list given to you today.  *If you need a refill on your cardiac medications before your next appointment, please call your pharmacy*   Lab Work: none If you have labs (blood work) drawn today and your tests are completely normal, you will receive your results only by: MyChart Message (if you have MyChart) OR A paper copy in the mail If you have any lab test that is abnormal or we need to change your treatment, we will call you to review the results.   Testing/Procedures: none   Follow-Up: At [facility], you and your health needs are our priority.  As part of our continuing mission to provide you with exceptional heart care, we have created designated Provider Care Teams.  These Care Teams include your primary Cardiologist (physician) and Advanced Practice Providers (APPs -  Physician Assistants and Nurse Practitioners) who all work together to provide you with the care you need, when you need it.  We recommend signing up for the patient portal called "MyChart".  Sign up information is provided on this After Visit Summary.  MyChart is used to connect with patients for Virtual Visits (Telemedicine).  Patients are able to view lab/test results, encounter notes, upcoming appointments, etc.  Non-urgent messages can be sent to your provider as well.   To learn more about what you can do with MyChart, go to [email or web address].    Your next appointment:   12 month(s)  The format for your next appointment:   In Person  Provider:   Jayadeep Varanasi, MD     Other Instructions  High-Fiber Eating Plan Fiber, also called dietary fiber, is a type of carbohydrate. It is found foods such as fruits, vegetables, whole grains, and beans. A high-fiber diet can have many health benefits. Your health care provider may recommend a high-fiber diet  to help: Prevent constipation. Fiber can make your bowel movements more regular. Lower your cholesterol. Relieve the following conditions: Inflammation of veins in the anus (hemorrhoids). Inflammation of specific areas of the digestive tract (uncomplicated diverticulosis). A problem of the large intestine, also called the colon, that sometimes causes pain and diarrhea (irritable bowel syndrome, or IBS). Prevent overeating as part of a weight-loss plan. Prevent heart disease, type 2 diabetes, and certain cancers. What are tips for following this plan? Reading food labels  Check the nutrition facts label on food products for the amount of dietary fiber. Choose foods that have 5 grams of fiber or more per serving. The goals for recommended daily fiber intake include: Men (age 50 or younger): 34-38 g. Men (over age 50): 28-34 g. Women (age 50 or younger): 25-28 g. Women (over age 50): 22-25 g. Your daily fiber goal is _____________ g. Shopping Choose whole fruits and vegetables instead of processed forms, such as apple juice or applesauce. Choose a wide variety of high-fiber foods such as avocados, lentils, oats, and kidney beans. Read the nutrition facts label of the foods you choose. Be aware of foods with added fiber. These foods often have high sugar and sodium amounts per serving. Cooking Use whole-grain flour for baking and cooking. Cook with brown rice instead of white rice. Meal planning Start the day with a breakfast that is high in fiber, such as a cereal that contains 5 g of fiber or more per serving. Eat breads and cereals that are made with whole-grain flour instead of   refined flour or white flour. Eat brown rice, bulgur wheat, or millet instead of white rice. Use beans in place of meat in soups, salads, and pasta dishes. Be sure that half of the grains you eat each day are whole grains. General information You can get the recommended daily intake of dietary fiber  by: Eating a variety of fruits, vegetables, grains, nuts, and beans. Taking a fiber supplement if you are not able to take in enough fiber in your diet. It is better to get fiber through food than from a supplement. Gradually increase how much fiber you consume. If you increase your intake of dietary fiber too quickly, you may have bloating, cramping, or gas. Drink plenty of water to help you digest fiber. Choose high-fiber snacks, such as berries, raw vegetables, nuts, and popcorn. What foods should I eat? Fruits Berries. Pears. Apples. Oranges. Avocado. Prunes and raisins. Dried figs. Vegetables Sweet potatoes. Spinach. Kale. Artichokes. Cabbage. Broccoli. Cauliflower. Green peas. Carrots. Squash. Grains Whole-grain breads. Multigrain cereal. Oats and oatmeal. Brown rice. Barley. Bulgur wheat. Millet. Quinoa. Bran muffins. Popcorn. Rye wafer crackers. Meats and other proteins Navy beans, kidney beans, and pinto beans. Soybeans. Split peas. Lentils. Nuts and seeds. Dairy Fiber-fortified yogurt. Beverages Fiber-fortified soy milk. Fiber-fortified orange juice. Other foods Fiber bars. The items listed above may not be a complete list of recommended foods and beverages. Contact a dietitian for more information. What foods should I avoid? Fruits Fruit juice. Cooked, strained fruit. Vegetables Fried potatoes. Canned vegetables. Well-cooked vegetables. Grains White bread. Pasta made with refined flour. White rice. Meats and other proteins Fatty cuts of meat. Fried chicken or fried fish. Dairy Milk. Yogurt. Cream cheese. Sour cream. Fats and oils Butters. Beverages Soft drinks. Other foods Cakes and pastries. The items listed above may not be a complete list of foods and beverages to avoid. Talk with your dietitian about what choices are best for you. Summary Fiber is a type of carbohydrate. It is found in foods such as fruits, vegetables, whole grains, and beans. A high-fiber  diet has many benefits. It can help to prevent constipation, lower blood cholesterol, aid weight loss, and reduce your risk of heart disease, diabetes, and certain cancers. Increase your intake of fiber gradually. Increasing fiber too quickly may cause cramping, bloating, and gas. Drink plenty of water while you increase the amount of fiber you consume. The best sources of fiber include whole fruits and vegetables, whole grains, nuts, seeds, and beans. This information is not intended to replace advice given to you by your health care provider. Make sure you discuss any questions you have with your health care provider. Document Revised: 03/14/2020 Document Reviewed: 03/14/2020 Elsevier Patient Education  2023 Elsevier Inc.   Important Information About Sugar       

## 2022-03-23 ENCOUNTER — Other Ambulatory Visit: Payer: Self-pay | Admitting: *Deleted

## 2022-03-23 DIAGNOSIS — I10 Essential (primary) hypertension: Secondary | ICD-10-CM

## 2022-03-23 MED ORDER — AMLODIPINE BESY-BENAZEPRIL HCL 10-40 MG PO CAPS: 1.0000 | ORAL_CAPSULE | Freq: Every day | ORAL | 11 refills | Status: DC

## 2022-04-15 ENCOUNTER — Other Ambulatory Visit: Payer: Self-pay | Admitting: Interventional Cardiology

## 2022-05-11 ENCOUNTER — Other Ambulatory Visit: Payer: Self-pay | Admitting: Interventional Cardiology

## 2022-05-11 DIAGNOSIS — I10 Essential (primary) hypertension: Secondary | ICD-10-CM

## 2022-06-03 ENCOUNTER — Other Ambulatory Visit: Payer: Self-pay | Admitting: Interventional Cardiology

## 2022-09-15 ENCOUNTER — Other Ambulatory Visit: Payer: Self-pay | Admitting: Interventional Cardiology

## 2022-09-15 DIAGNOSIS — I10 Essential (primary) hypertension: Secondary | ICD-10-CM

## 2023-01-10 ENCOUNTER — Other Ambulatory Visit: Payer: Self-pay | Admitting: Interventional Cardiology

## 2023-01-10 DIAGNOSIS — I10 Essential (primary) hypertension: Secondary | ICD-10-CM

## 2023-01-11 ENCOUNTER — Other Ambulatory Visit: Payer: Self-pay | Admitting: Interventional Cardiology

## 2023-01-11 DIAGNOSIS — I10 Essential (primary) hypertension: Secondary | ICD-10-CM

## 2023-02-10 LAB — LAB REPORT - SCANNED
A1c: 7.8
EGFR: 67

## 2023-02-15 ENCOUNTER — Other Ambulatory Visit: Payer: Self-pay | Admitting: *Deleted

## 2023-02-15 DIAGNOSIS — I10 Essential (primary) hypertension: Secondary | ICD-10-CM

## 2023-02-15 MED ORDER — HYDROCHLOROTHIAZIDE 25 MG PO TABS: ORAL_TABLET | ORAL | 0 refills | Status: DC

## 2023-04-05 ENCOUNTER — Ambulatory Visit: Payer: BC Managed Care – PPO | Admitting: Interventional Cardiology

## 2023-04-06 NOTE — Progress Notes (Deleted)
Office Visit    Patient Name: Amber Kelly Date of Encounter: 04/06/2023  Primary Care Provider:  Milus Height, PA Primary Cardiologist:  Amber Muss, MD Primary Electrophysiologist: None   Past Medical History    Past Medical History:  Diagnosis Date   BV (bacterial vaginosis)    Diabetes mellitus ([facility])    Dysmenorrhea    Fibroids    Hypertension    Menorrhagia    Past Surgical History:  Procedure Laterality Date   ABDOMINAL HYSTERECTOMY Bilateral 05/16/2013   Procedure: HYSTERECTOMY ABDOMINAL WITH BILATERAL SALPINGECTOMY;  Surgeon: Amber Morales, MD;  Location: [facility];  Service: Gynecology;  Laterality: Bilateral;   CESAREAN SECTION     TUBAL LIGATION  1996    Allergies  Allergies  Allergen Reactions   Metformin Other (See Comments)     History of Present Illness    Amber Kelly  is a 62 year old female with a PMH of hypertension, DM II, NSVT, obesity, HLD who presents today for 1 year follow-up.  Ms. Amber Kelly was seen initially by Dr. Eldridge Kelly and 2021 for evaluation of palpitations.  She wore a event monitor in 2020 that showed brief SVT with longest episode of 12 beats.  She was started on carvedilol 6.25 mg twice daily.  She underwent a sleep study and was found to have sleep apnea.  She was also referred to the HTN clinic and underwent titration of medications.  She was last seen by Dr. Eldridge Kelly on 03/20/2022 for follow-up.  During visit patient reported no episodes of palpitations and blood pressure was controlled.  She is in the process also losing weight.  Since last being seen in the office patient reports***.  Patient denies chest pain, palpitations, dyspnea, PND, orthopnea, nausea, vomiting, dizziness, syncope, edema, weight gain, or early satiety.     ***Notes:  Home Medications    Current Outpatient Medications  Medication Sig Dispense Refill   amLODipine-benazepril (LOTREL) 10-40 MG capsule Take 1 capsule by mouth daily. 30  capsule 11   atorvastatin (LIPITOR) 10 MG tablet Take 1 tablet (10 mg total) by mouth daily. (Patient not taking: Reported on 03/20/2022) 30 tablet 11   atorvastatin (LIPITOR) 40 MG tablet Take 40 mg by mouth daily.     CALCIUM PO Take 1 tablet by mouth daily.     carvedilol (COREG) 6.25 MG tablet TAKE 1 TABLET(6.25 MG) BY MOUTH TWICE DAILY WITH A MEAL 60 tablet 9   ciclopirox (LOPROX) 0.77 % SUSP Aaa qd 60 mL 6   cycloSPORINE (RESTASIS) 0.05 % ophthalmic emulsion Place 1 drop into both eyes 2 (two) times daily.     empagliflozin (JARDIANCE) 25 MG TABS tablet Take 25 mg by mouth daily.     fluticasone (FLONASE) 50 MCG/ACT nasal spray Place 1 spray into both nostrils as needed.     hydrochlorothiazide (HYDRODIURIL) 25 MG tablet TAKE 1 TABLET(25 MG) BY MOUTH DAILY 90 tablet 0   ketoconazole (NIZORAL) 2 % cream Aaa qd 60 g 6   KRILL OIL OMEGA-3 PO Take 1 capsule by mouth daily. Mega Red     minocycline (MINOCIN) 100 MG capsule Take 1 capsule (100 mg total) by mouth daily. 30 capsule 4   Multiple Vitamin (MULTIVITAMIN WITH MINERALS) TABS Take 1 tablet by mouth daily.     RYBELSUS 7 MG TABS Take 1 tablet by mouth every morning. Per patient taking 1/2 tablet daily     spironolactone (ALDACTONE) 50 MG tablet TAKE 1 TABLET(50 MG)  BY MOUTH DAILY 90 tablet 3   No current facility-administered medications for this visit.     Review of Systems  Please see the history of present illness.    (+)*** (+)***  All other systems reviewed and are otherwise negative except as noted above.  Physical Exam    Wt Readings from Last 3 Encounters:  03/20/22 163 lb (73.9 kg)  01/16/21 167 lb 12.8 oz (76.1 kg)  12/20/19 183 lb (83 kg)   ZO:XWRUE were no vitals filed for this visit.,There is no Kelly or weight on file to calculate BMI.  Constitutional:      Appearance: Healthy appearance. Not in distress.  Neck:     Vascular: JVD normal.  Pulmonary:     Effort: Pulmonary effort is normal.     Breath  sounds: No wheezing. No rales. Diminished in the bases Cardiovascular:     Normal rate. Regular rhythm. Normal S1. Normal S2.      Murmurs: There is no murmur.  Edema:    Peripheral edema absent.  Abdominal:     Palpations: Abdomen is soft non tender. There is no hepatomegaly.  Skin:    General: Skin is warm and dry.  Neurological:     General: No focal deficit present.     Mental Status: Alert and oriented to person, place and time.     Cranial Nerves: Cranial nerves are intact.  EKG/LABS/ Recent Cardiac Studies    ECG personally reviewed by me today - ***  Cardiac Studies & Procedures         MONITORS  LONG TERM MONITOR (3-14 DAYS) 10/05/2019  Narrative  Normal sinus rhythm  PAC's and rare PVC's are moted  Brief SVT with longest 12 beats  Symptoms correlate with brief SVT and premature beats.  No atrial fibrillation or VT noted  Symptoms correlated with premature beats and brief SVT           Risk Assessment/Calculations:   {Does this patient have ATRIAL FIBRILLATION?[phone]}        Lab Results  Component Value Date   WBC 9.6 05/17/2013   HGB 10.4 (L) 05/17/2013   HCT 31.5 (L) 05/17/2013   MCV 81.6 05/17/2013   PLT 260 05/17/2013   Lab Results  Component Value Date   CREATININE 0.84 05/13/2020   BUN 16 05/13/2020   NA 138 05/13/2020   K 4.1 05/13/2020   CL 97 05/13/2020   CO2 22 05/13/2020   No results found for: "ALT", "AST", "GGT", "ALKPHOS", "BILITOT" No results found for: "CHOL", "HDL", "LDLCALC", "LDLDIRECT", "TRIG", "CHOLHDL"  No results found for: "HGBA1C"   Assessment & Plan    1.  Essential hypertension: -Patient's blood pressure today was***  2.  Supraventricular tachycardia: -Previous event monitor in 09/2019 showing brief SVT run of 12 beats -Today patient reports***  3.  Obstructive sleep apnea: -Patient reports*** -Continue weight loss  4.  Hyperlipidemia: -Patient's last LDL cholesterol was***  5.  DM type  II: -Patient denies hemoglobin A1c was***      Disposition: Follow-up with Amber Muss, MD or APP in *** months {Are you ordering a CV Procedure (e.g. stress test, cath, DCCV, TEE, etc)?   Press F2        :[id number]}   Medication Adjustments/Labs and Tests Ordered: Current medicines are reviewed at length with the patient today.  Concerns regarding medicines are outlined above.   Signed, Napoleon Form, Leodis Rains, NP 04/06/2023, 1:24 PM [facility]

## 2023-04-08 ENCOUNTER — Ambulatory Visit: Payer: BC Managed Care – PPO | Admitting: Nurse Practitioner

## 2023-04-08 DIAGNOSIS — I10 Essential (primary) hypertension: Secondary | ICD-10-CM

## 2023-04-08 DIAGNOSIS — G4730 Sleep apnea, unspecified: Secondary | ICD-10-CM

## 2023-04-08 DIAGNOSIS — E119 Type 2 diabetes mellitus without complications: Secondary | ICD-10-CM

## 2023-04-08 DIAGNOSIS — E669 Obesity, unspecified: Secondary | ICD-10-CM

## 2023-04-08 DIAGNOSIS — I471 Supraventricular tachycardia, unspecified: Secondary | ICD-10-CM

## 2023-04-22 ENCOUNTER — Other Ambulatory Visit: Payer: Self-pay | Admitting: Interventional Cardiology

## 2023-04-22 DIAGNOSIS — I10 Essential (primary) hypertension: Secondary | ICD-10-CM

## 2023-04-28 NOTE — Progress Notes (Unsigned)
Office Visit    Patient Name: Amber Kelly Date of Encounter: 04/29/2023  Primary Care Provider:  Milus Height, PA Primary Cardiologist:  Lance Muss, MD Primary Electrophysiologist: None   Past Medical History    Past Medical History:  Diagnosis Date   BV (bacterial vaginosis)    Diabetes mellitus (HCC)    Dysmenorrhea    Fibroids    Hypertension    Menorrhagia    Past Surgical History:  Procedure Laterality Date   ABDOMINAL HYSTERECTOMY Bilateral 05/16/2013   Procedure: HYSTERECTOMY ABDOMINAL WITH BILATERAL SALPINGECTOMY;  Surgeon: Hal Morales, MD;  Location: [facility];  Service: Gynecology;  Laterality: Bilateral;   CESAREAN SECTION     TUBAL LIGATION  1996    Allergies  Allergies  Allergen Reactions   Metformin Other (See Comments)     History of Present Illness    Amber Kelly  is a 62 year old female with a PMH of hypertension, DM II, NSVT, obesity, HLD who presents today for 1 year follow-up.   Amber Kelly was seen initially by Dr. Eldridge Dace and 2021 for evaluation of palpitations.  She wore a event monitor in 2020 that showed brief SVT with longest episode of 12 beats.  She was started on carvedilol 6.25 mg twice daily.  She underwent a sleep study and was found to have sleep apnea.  She was also referred to the HTN clinic and underwent titration of medications.  She was last seen by Dr. Eldridge Dace on 03/20/2022 for follow-up.  During visit patient reported no episodes of palpitations and blood pressure was controlled.  She is in the process also losing weight.   Amber Kelly presents today for 1 year follow-up alone.  Since last being seen in the office patient reports doing well with no new cardiac complaints.  Her blood pressure today was initially elevated 134/92 and was 138/86 on recheck.  She reports confusion and missed doses of her blood pressure medicine due to organizational concerns with her pill bottles.  She is active and exercises daily and  bikes 1 mile also walks at the [facility].  She retired as a Engineer, site and 2018.  She denies any palpitations or arrhythmias since her previous visit.  She does report indiscretions with salt and shake salt on foods such as spaghetti, noodles, and grits.  Patient denies chest pain, palpitations, dyspnea, PND, orthopnea, nausea, vomiting, dizziness, syncope, edema, weight gain, or early satiety.    Home Medications    Current Outpatient Medications  Medication Sig Dispense Refill   amLODipine-benazepril (LOTREL) 10-40 MG capsule TAKE 1 CAPSULE BY MOUTH DAILY 90 capsule 0   atorvastatin (LIPITOR) 40 MG tablet Take 40 mg by mouth daily.     CALCIUM PO Take 1 tablet by mouth daily.     carvedilol (COREG) 6.25 MG tablet TAKE 1 TABLET(6.25 MG) BY MOUTH TWICE DAILY WITH A MEAL 60 tablet 9   ciclopirox (LOPROX) 0.77 % SUSP Aaa qd 60 mL 6   cycloSPORINE (RESTASIS) 0.05 % ophthalmic emulsion Place 1 drop into both eyes 2 (two) times daily.     empagliflozin (JARDIANCE) 25 MG TABS tablet Take 25 mg by mouth daily.     fluticasone (FLONASE) 50 MCG/ACT nasal spray Place 1 spray into both nostrils as needed.     hydrochlorothiazide (HYDRODIURIL) 25 MG tablet TAKE 1 TABLET(25 MG) BY MOUTH DAILY 90 tablet 0   ketoconazole (NIZORAL) 2 % cream Aaa qd 60 g 6   KRILL OIL  OMEGA-3 PO Take 1 capsule by mouth daily. Mega Red     minocycline (MINOCIN) 100 MG capsule Take 1 capsule (100 mg total) by mouth daily. 30 capsule 4   Multiple Vitamin (MULTIVITAMIN WITH MINERALS) TABS Take 1 tablet by mouth daily.     RYBELSUS 7 MG TABS Take 1 tablet by mouth every morning. Per patient taking 1/2 tablet daily     spironolactone (ALDACTONE) 50 MG tablet Take 1 tablet (50 mg total) by mouth daily. 90 tablet 0   No current facility-administered medications for this visit.     Review of Systems  Please see the history of present illness.     All other systems reviewed and are otherwise negative except as noted  above.  Physical Exam    Wt Readings from Last 3 Encounters:  04/29/23 168 lb 6.4 oz (76.4 kg)  03/20/22 163 lb (73.9 kg)  01/16/21 167 lb 12.8 oz (76.1 kg)   VS: Vitals:   04/29/23 1007  BP: (!) 134/92  Pulse: 63  SpO2: 97%  ,Body mass index is 30.8 kg/m.  Constitutional:      Appearance: Healthy appearance. Not in distress.  Neck:     Vascular: JVD normal.  Pulmonary:     Effort: Pulmonary effort is normal.     Breath sounds: No wheezing. No rales. Diminished in the bases Cardiovascular:     Normal rate. Regular rhythm. Normal S1. Normal S2.      Murmurs: There is no murmur.  Edema:    Peripheral edema absent.  Abdominal:     Palpations: Abdomen is soft non tender. There is no hepatomegaly.  Skin:    General: Skin is warm and dry.  Neurological:     General: No focal deficit present.     Mental Status: Alert and oriented to person, place and time.     Cranial Nerves: Cranial nerves are intact.  EKG/LABS/ Recent Cardiac Studies    ECG personally reviewed by me today -sinus rhythm with artifact and rate of 63 bpm changes consistent with previous EKG.  Cardiac Studies & Procedures         MONITORS  LONG TERM MONITOR (3-14 DAYS) 10/05/2019  Narrative  Normal sinus rhythm  PAC's and rare PVC's are moted  Brief SVT with longest 12 beats  Symptoms correlate with brief SVT and premature beats.  No atrial fibrillation or VT noted  Symptoms correlated with premature beats and brief SVT           Lab Results  Component Value Date   WBC 9.6 05/17/2013   HGB 10.4 (L) 05/17/2013   HCT 31.5 (L) 05/17/2013   MCV 81.6 05/17/2013   PLT 260 05/17/2013   Lab Results  Component Value Date   CREATININE 0.84 05/13/2020   BUN 16 05/13/2020   NA 138 05/13/2020   K 4.1 05/13/2020   CL 97 05/13/2020   CO2 22 05/13/2020   No results found for: "ALT", "AST", "GGT", "ALKPHOS", "BILITOT" No results found for: "CHOL", "HDL", "LDLCALC", "LDLDIRECT", "TRIG",  "CHOLHDL"  No results found for: "HGBA1C"   Assessment & Plan    1.  Essential hypertension: -Patient's blood pressure today was initially elevated at 134/92 and was 138/86 on recheck. -Patient reports compliance issues with remembering to take her medication and was provided a pillbox today for better compliance. -We will have her check her blood pressure over the next 2 weeks report readings back to the office. -Continue Lotrel 10-40 mg daily, Aldactone  50 mg daily, HCTZ 25 mg daily, carvedilol 6.25 mg twice daily -She reports major indiscretions with salt and was provided information on the DASH diet.   2.  Supraventricular tachycardia: -Previous event monitor in 09/2019 showing brief SVT run of 12 beats -Today patient reports no recurrence of palpitations since her previous visit. -Continue carvedilol 6.25 mg twice daily   3.  Obstructive sleep apnea: -Patient reports that she has not finished her titration process due to cost. -We will refer her information to our sleep apnea coordinator for further assistance.   4.  Hyperlipidemia: -Patient's last LDL cholesterol was 80 Continue Lipitor 40 mg daily   5.  DM type II: -Patient denies hemoglobin A1c was 7.8 -Continue Jardiance 25 mg daily, Rybelsus 3.5 mg daily  Disposition: Follow-up with Lance Muss, MD or APP in 12 months   Medication Adjustments/Labs and Tests Ordered: Current medicines are reviewed at length with the patient today.  Concerns regarding medicines are outlined above.   Signed, Napoleon Form, Leodis Rains, NP 04/29/2023, 10:44 AM [facility]

## 2023-04-29 ENCOUNTER — Encounter: Payer: Self-pay | Admitting: Nurse Practitioner

## 2023-04-29 ENCOUNTER — Ambulatory Visit: Payer: BC Managed Care – PPO | Attending: Interventional Cardiology | Admitting: Nurse Practitioner

## 2023-04-29 VITALS — BP 138/86 | HR 63 | Ht 62.0 in | Wt 168.4 lb

## 2023-04-29 DIAGNOSIS — E119 Type 2 diabetes mellitus without complications: Secondary | ICD-10-CM

## 2023-04-29 DIAGNOSIS — I10 Essential (primary) hypertension: Secondary | ICD-10-CM

## 2023-04-29 DIAGNOSIS — I471 Supraventricular tachycardia, unspecified: Secondary | ICD-10-CM

## 2023-04-29 DIAGNOSIS — Z7984 Long term (current) use of oral hypoglycemic drugs: Secondary | ICD-10-CM

## 2023-04-29 DIAGNOSIS — E785 Hyperlipidemia, unspecified: Secondary | ICD-10-CM

## 2023-04-29 DIAGNOSIS — G4730 Sleep apnea, unspecified: Secondary | ICD-10-CM | POA: Diagnosis not present

## 2023-04-29 NOTE — Patient Instructions (Addendum)
Medication Instructions:  Your physician recommends that you continue on your current medications as directed. Please refer to the Current Medication list given to you today. *If you need a refill on your cardiac medications before your next appointment, please call your pharmacy*   Lab Work: None ordered   Testing/Procedures: None Ordered   Follow-Up: At [facility], you and your health needs are our priority.  As part of our continuing mission to provide you with exceptional heart care, we have created designated Provider Care Teams.  These Care Teams include your primary Cardiologist (physician) and Advanced Practice Providers (APPs -  Physician Assistants and Nurse Practitioners) who all work together to provide you with the care you need, when you need it.  We recommend signing up for the patient portal called "MyChart".  Sign up information is provided on this After Visit Summary.  MyChart is used to connect with patients for Virtual Visits (Telemedicine).  Patients are able to view lab/test results, encounter notes, upcoming appointments, etc.  Non-urgent messages can be sent to your provider as well.   To learn more about what you can do with MyChart, go to [email or web address].    Your next appointment:   12 month(s)  Provider:   Lance Muss, MD     Other Instructions  Check your blood pressure daily for 2 weeks, then contact the office with your readings.

## 2023-05-06 ENCOUNTER — Other Ambulatory Visit: Payer: Self-pay | Admitting: Interventional Cardiology

## 2023-05-06 DIAGNOSIS — I10 Essential (primary) hypertension: Secondary | ICD-10-CM

## 2023-06-14 ENCOUNTER — Other Ambulatory Visit: Payer: Self-pay | Admitting: Interventional Cardiology

## 2023-07-30 ENCOUNTER — Other Ambulatory Visit: Payer: Self-pay | Admitting: Interventional Cardiology

## 2023-08-23 ENCOUNTER — Other Ambulatory Visit: Payer: Self-pay | Admitting: Interventional Cardiology

## 2023-08-23 DIAGNOSIS — I10 Essential (primary) hypertension: Secondary | ICD-10-CM

## 2024-04-18 ENCOUNTER — Other Ambulatory Visit: Payer: Self-pay | Admitting: Interventional Cardiology

## 2024-04-18 DIAGNOSIS — I10 Essential (primary) hypertension: Secondary | ICD-10-CM

## 2024-05-08 ENCOUNTER — Encounter: Payer: Self-pay | Admitting: Physician Assistant

## 2024-05-08 ENCOUNTER — Ambulatory Visit: Payer: Self-pay | Attending: Physician Assistant | Admitting: Cardiology

## 2024-05-08 VITALS — BP 103/70 | HR 70 | Ht 62.0 in | Wt 164.0 lb

## 2024-05-08 DIAGNOSIS — I10 Essential (primary) hypertension: Secondary | ICD-10-CM | POA: Diagnosis not present

## 2024-05-08 DIAGNOSIS — I471 Supraventricular tachycardia, unspecified: Secondary | ICD-10-CM

## 2024-05-08 DIAGNOSIS — E785 Hyperlipidemia, unspecified: Secondary | ICD-10-CM

## 2024-05-08 DIAGNOSIS — E119 Type 2 diabetes mellitus without complications: Secondary | ICD-10-CM | POA: Diagnosis not present

## 2024-05-08 DIAGNOSIS — G4730 Sleep apnea, unspecified: Secondary | ICD-10-CM

## 2024-05-08 NOTE — Patient Instructions (Signed)
 Medication Instructions:  Your physician recommends that you continue on your current medications as directed. Please refer to the Current Medication list given to you today.  *If you need a refill on your cardiac medications before your next appointment, please call your pharmacy*  Lab Work: None ordered.  If you have labs (blood work) drawn today and your tests are completely normal, you will receive your results only by: MyChart Message (if you have MyChart) OR A paper copy in the mail If you have any lab test that is abnormal or we need to change your treatment, we will call you to review the results.  Testing/Procedures: None ordered.   Follow-Up: At [facility], you and your health needs are our priority.  As part of our continuing mission to provide you with exceptional heart care, our providers are all part of one team.  This team includes your primary Cardiologist (physician) and Advanced Practice Providers or APPs (Physician Assistants and Nurse Practitioners) who all work together to provide you with the care you need, when you need it.  Your next appointment:   12 months with Dr Filiberto Hug

## 2024-05-08 NOTE — Progress Notes (Signed)
 Cardiology Office Note:  .   Date:  05/08/2024  ID:  Aureliano Blow, DOB [birth year], MRN [id number] PCP: Diamond Formica, PA  [facility] Providers Cardiologist:  Avery Bodo, MD {  History of Present Illness: .   Amber Kelly is a 63 y.o. female with history of hypertension, type 2 diabetes, SVT, hyperlipidemia, OSA.     Palpitations Reason for initial cardiac evaluation.  Heart monitor 2020 showing brief SVT with the longest being 12 beats.  Started on carvedilol  6.25 mg twice daily Stable  Social history Likes 1 mile walks at [employer].  Retired Chartered loss adjuster.     Patient with history of very brief SVT noted on heart monitor in 2020 here for annual follow-up.  Does not report any cardiac symptoms as below.  She reportedly saw her PCP recently and had A1c of 9% which she is also seeing endocrinology for.  She is making stronger efforts to stay hydrated and to monitor this as she does not want to be on insulin .  Otherwise no other complaints today.  Continues to be active and exercising at the [facility].  Has not had hardly any palpitations and happy with current therapy.  ROS: Denies: Chest pain, shortness of breath, orthopnea, peripheral edema, palpitations, decreased exercise intolerance, fatigue, lightheadedness.   Studies Reviewed: Aaron Aas    EKG Interpretation Date/Time:  Monday May 08 2024 09:24:07 EDT Ventricular Rate:  65 PR Interval:  180 QRS Duration:  80 QT Interval:  394 QTC Calculation: 409 R Axis:   75  Text Interpretation: Normal sinus rhythm Normal ECG When compared with ECG of 11-May-2013 10:31, inferior lateral ST upsloping noted. No significant change was found Confirmed by Morgan Arab [phone]) on 05/08/2024 9:34:10 AM    Risk Assessment/Calculations:             Physical Exam:   VS:  BP 103/70   Pulse 70   Ht 5' 2 (1.575 m)   Wt 164 lb (74.4 kg)   LMP 05/09/2013   SpO2 99%   BMI 30.00 kg/m    Wt Readings from Last 3 Encounters:  05/08/24  164 lb (74.4 kg)  04/29/23 168 lb 6.4 oz (76.4 kg)  03/20/22 163 lb (73.9 kg)    GEN: Well nourished, well developed in no acute distress NECK: No JVD; No carotid bruits CARDIAC: RRR, 1/6 murmur RESPIRATORY:  Clear to auscultation without rales, wheezing or rhonchi  ABDOMEN: Soft, non-tender, non-distended EXTREMITIES:  No edema; No deformity   ASSESSMENT AND PLAN: .    SVT Noted on heart monitor in 2020, very brief episode of SVT with the longest being 12 beats.  Stable, infrequent symptoms. Continue with carvedilol  6.25 mg twice daily  Hypertension Excellent control 103/70 today. Continue Lotrel 10-40 mg, carvedilol , HCTZ 25 mg, spironolactone  50 mg  Type 2 diabetes Reportedly had A1c of 9% at most recent PCP's visit.  Seeing endocrinology now.  Would like to avoid insulin . Continuing to encourage lifestyle modifications.  Continue with Jardiance  25 mg daily  Hyperlipidemia Managed by PCP. Continue atorvastatin  40 mg daily  OSA Unable to see records of this.  Discussed this is a significant cardiovascular perspective there.  Encouraged her to reach out to determine if she needs repeat sleep study or CPAP machine.  Murmur Very soft 1 out of 6 murmur.  In the absence of any symptoms and being very functional, do not feel compelled that she needs echocardiogram at this time.  May consider in the future.  Dispo: 1 year follow-up, really does not have any cardiac needs however she would like to stay with cardiology.  Signed, Burnetta Cart, PA-C

## 2024-05-11 ENCOUNTER — Other Ambulatory Visit: Payer: Self-pay | Admitting: Interventional Cardiology

## 2024-05-29 ENCOUNTER — Other Ambulatory Visit: Payer: Self-pay | Admitting: Interventional Cardiology

## 2024-05-29 DIAGNOSIS — I10 Essential (primary) hypertension: Secondary | ICD-10-CM

## 2024-06-13 ENCOUNTER — Other Ambulatory Visit: Payer: Self-pay | Admitting: Interventional Cardiology
